# Patient Record
Sex: Female | Born: 1970 | Race: White | Hispanic: No | Marital: Married | Smoking: Former smoker
Health system: Southern US, Community
[De-identification: ages and names within clinical notes are randomized; demographics above are authoritative.]

## PROBLEM LIST (undated history)

## (undated) DIAGNOSIS — G43909 Migraine, unspecified, not intractable, without status migrainosus: Secondary | ICD-10-CM

## (undated) HISTORY — PX: GASTRIC BYPASS: SHX52

---

## 2018-04-01 ENCOUNTER — Emergency Department: Admission: EM | Admit: 2018-04-01 | Discharge: 2018-04-01 | Discharge status: Against Medical Advice | Payer: Self-pay

## 2018-06-13 ENCOUNTER — Ambulatory Visit
Admission: EM | Admit: 2018-06-13 | Discharge: 2018-06-13 | Disposition: A | Discharge status: Home / Self Care | Payer: Self-pay | Attending: Family Medicine | Admitting: Family Medicine

## 2018-06-13 ENCOUNTER — Other Ambulatory Visit: Payer: Self-pay

## 2018-06-13 ENCOUNTER — Encounter: Payer: Self-pay | Admitting: Emergency Medicine

## 2018-06-13 DIAGNOSIS — J069 Acute upper respiratory infection, unspecified: Secondary | ICD-10-CM

## 2018-06-13 DIAGNOSIS — B9789 Other viral agents as the cause of diseases classified elsewhere: Secondary | ICD-10-CM

## 2018-06-13 DIAGNOSIS — R062 Wheezing: Secondary | ICD-10-CM

## 2018-06-13 DIAGNOSIS — F17210 Nicotine dependence, cigarettes, uncomplicated: Secondary | ICD-10-CM

## 2018-06-13 MED ORDER — ALBUTEROL SULFATE HFA 108 (90 BASE) MCG/ACT IN AERS: 1.0000 | INHALATION_SPRAY | Freq: Four times a day (QID) | RESPIRATORY_TRACT | 0 refills | Status: DC | PRN

## 2018-06-13 MED ORDER — PREDNISONE 20 MG PO TABS: ORAL_TABLET | ORAL | 0 refills | Status: DC

## 2018-06-13 MED ORDER — BENZONATATE 200 MG PO CAPS: 200.0000 mg | ORAL_CAPSULE | Freq: Three times a day (TID) | ORAL | 0 refills | Status: DC | PRN

## 2018-06-13 NOTE — ED Provider Notes (Signed)
[facility]    CSN: [id number] Arrival date & time: 06/13/18  1849     History   Chief Complaint Chief Complaint  Patient presents with  . Cough  . Nasal Congestion  . Wheezing    HPI Terri Conner is a 47 y.o. female.   The history is provided by the patient.  Cough  Associated symptoms: rhinorrhea and wheezing   Wheezing  Associated symptoms: cough and rhinorrhea   URI  Presenting symptoms: congestion, cough and rhinorrhea   Severity:  Moderate Onset quality:  Sudden Duration:  4 days Timing:  Constant Progression:  Unchanged Chronicity:  New Relieved by:  None tried Ineffective treatments:  None tried Associated symptoms: wheezing   Risk factors: sick contacts   Risk factors: no recent travel     History reviewed. No pertinent past medical history.  There are no active problems to display for this patient.   Past Surgical History:  Procedure Laterality Date  . GASTRIC BYPASS      OB History   No obstetric history on file.      Home Medications    Prior to Admission medications   Medication Sig Start Date End Date Taking? Authorizing Provider  albuterol (PROVENTIL HFA;VENTOLIN HFA) 108 (90 Base) MCG/ACT inhaler Inhale 1-2 puffs into the lungs every 6 (six) hours as needed for wheezing or shortness of breath. 06/13/18   Payton Mccallum, MD  benzonatate (TESSALON) 200 MG capsule Take 1 capsule (200 mg total) by mouth 3 (three) times daily as needed. 06/13/18   Payton Mccallum, MD  predniSONE (DELTASONE) 20 MG tablet 3 tabs po once day 1, then 2 tabs po qd x 2 days, then 1 tab po qd x 2 days, then half a tab po qd x 2 days 06/13/18   Payton Mccallum, MD    Family History Family History  Problem Relation Age of Onset  . Anxiety disorder Mother   . Depression Mother   . Heart attack Father 66    Social History Social History   Tobacco Use  . Smoking status: Current Every Day Smoker    Packs/day: 1.00    Years: 18.00    Pack years:  18.00    Types: Cigarettes  . Smokeless tobacco: Never Used  Substance Use Topics  . Alcohol use: Yes    Comment: social  . Drug use: Not Currently    Comment: patient last used ~20 years ago, but wouldn't disclose     Allergies   Patient has no known allergies.   Review of Systems Review of Systems  HENT: Positive for congestion and rhinorrhea.   Respiratory: Positive for cough and wheezing.      Physical Exam Triage Vital Signs ED Triage Vitals  Enc Vitals Group     BP 06/13/18 1917 (!) 111/59     Pulse Rate 06/13/18 1917 74     Resp 06/13/18 1917 18     Temp 06/13/18 1917 98.2 F (36.8 C)     Temp Source 06/13/18 1917 Oral     SpO2 06/13/18 1917 100 %     Weight 06/13/18 1919 238 lb (108 kg)     Height 06/13/18 1919 5\' 2"  (1.575 m)     Head Circumference --      Peak Flow --      Pain Score 06/13/18 1918 0     Pain Loc --      Pain Edu? --      Excl. in GC? --  No data found.  Updated Vital Signs BP (!) 111/59 (BP Location: Left Arm)   Pulse 74   Temp 98.2 F (36.8 C) (Oral)   Resp 18   Ht 5\' 2"  (1.575 m)   Wt 108 kg   LMP 05/02/2018 (Approximate)   SpO2 100%   BMI 43.53 kg/m   Visual Acuity Right Eye Distance:   Left Eye Distance:   Bilateral Distance:    Right Eye Near:   Left Eye Near:    Bilateral Near:     Physical Exam Vitals signs and nursing note reviewed.  Constitutional:      General: She is not in acute distress.    Appearance: She is well-developed. She is not toxic-appearing or diaphoretic.  HENT:     Head: Normocephalic and atraumatic.     Nose: Rhinorrhea present.     Mouth/Throat:     Pharynx: Uvula midline. No oropharyngeal exudate.  Eyes:     General: No scleral icterus.       Right eye: No discharge.        Left eye: No discharge.  Neck:     Musculoskeletal: Normal range of motion and neck supple.     Thyroid: No thyromegaly.  Cardiovascular:     Rate and Rhythm: Normal rate and regular rhythm.     Heart  sounds: Normal heart sounds.  Pulmonary:     Effort: Pulmonary effort is normal. No respiratory distress.     Breath sounds: No stridor. Wheezing present. No rhonchi or rales.  Lymphadenopathy:     Cervical: No cervical adenopathy.  Neurological:     Mental Status: She is alert.      UC Treatments / Results  Labs (all labs ordered are listed, but only abnormal results are displayed) Labs Reviewed - No data to display  EKG None  Radiology No results found.  Procedures Procedures (including critical care time)  Medications Ordered in UC Medications - No data to display  Initial Impression / Assessment and Plan / UC Course  I have reviewed the triage vital signs and the nursing notes.  Pertinent labs & imaging results that were available during my care of the patient were reviewed by me and considered in my medical decision making (see chart for details).      Final Clinical Impressions(s) / UC Diagnoses   Final diagnoses:  Viral URI with cough  Wheezing    ED Prescriptions    Medication Sig Dispense Auth. Provider   albuterol (PROVENTIL HFA;VENTOLIN HFA) 108 (90 Base) MCG/ACT inhaler Inhale 1-2 puffs into the lungs every 6 (six) hours as needed for wheezing or shortness of breath. 1 Inhaler Nethra Mehlberg, Pamala Hurry, MD   predniSONE (DELTASONE) 20 MG tablet 3 tabs po once day 1, then 2 tabs po qd x 2 days, then 1 tab po qd x 2 days, then half a tab po qd x 2 days 10 tablet Austan Nicholl, Pamala Hurry, MD   benzonatate (TESSALON) 200 MG capsule Take 1 capsule (200 mg total) by mouth 3 (three) times daily as needed. 30 capsule Payton Mccallum, MD     1.  diagnosis reviewed with patient 2. rx as per orders above; reviewed possible side effects, interactions, risks and benefits  3. Recommend supportive treatment with rest, fluids 4. Follow-up prn if symptoms worsen or don't improve    Controlled Substance Prescriptions [place] Controlled Substance Registry consulted? Not Applicable   Payton Mccallum, MD 06/13/18 [phone]

## 2018-06-13 NOTE — ED Triage Notes (Signed)
Patient in today c/o cough, runny nose and wheezing x 4 days. Patient denies fever, but had chills x 1. Patient has tried OTC Tussin CF.

## 2018-07-05 ENCOUNTER — Other Ambulatory Visit: Payer: Self-pay

## 2018-07-05 ENCOUNTER — Encounter: Payer: Self-pay | Admitting: Emergency Medicine

## 2018-07-05 ENCOUNTER — Ambulatory Visit
Admission: EM | Admit: 2018-07-05 | Discharge: 2018-07-05 | Disposition: A | Discharge status: Home / Self Care | Payer: Self-pay | Attending: Urgent Care | Admitting: Urgent Care

## 2018-07-05 ENCOUNTER — Ambulatory Visit (INDEPENDENT_AMBULATORY_CARE_PROVIDER_SITE_OTHER): Payer: Self-pay

## 2018-07-05 DIAGNOSIS — W1842XA Slipping, tripping and stumbling without falling due to stepping into hole or opening, initial encounter: Secondary | ICD-10-CM

## 2018-07-05 DIAGNOSIS — S93402A Sprain of unspecified ligament of left ankle, initial encounter: Secondary | ICD-10-CM

## 2018-07-05 DIAGNOSIS — M25572 Pain in left ankle and joints of left foot: Secondary | ICD-10-CM

## 2018-07-05 DIAGNOSIS — S86002A Unspecified injury of left Achilles tendon, initial encounter: Secondary | ICD-10-CM

## 2018-07-05 MED ORDER — IBUPROFEN 800 MG PO TABS: 800.0000 mg | ORAL_TABLET | Freq: Three times a day (TID) | ORAL | 0 refills | Status: DC

## 2018-07-05 MED ORDER — PREDNISONE 20 MG PO TABS: 40.0000 mg | ORAL_TABLET | Freq: Every day | ORAL | 0 refills | Status: DC

## 2018-07-05 NOTE — ED Triage Notes (Signed)
Patient c/o left ankle injury today when she missed a step and rolled her ankle.

## 2018-07-05 NOTE — Discharge Instructions (Signed)
Nice to see you tonight. Sorry that it was under these circumstances.   Your x-ray was negative for fracture. Pain due to a sprained ankle. You have some strain in your Achilles area as well. We have wrapped your ankle. Keep your ankle wrapped for comfort. Also, use ice and elevation to help with the pain. I will send you in a prescription of ibuprofen 800 mg that you can take 3 times a day for pain and inflammation. I am also going to give you some steroids for the inflammation.   If you are still having pain in 1 week, please follow up with orthopedics. I have included the name and number of an excellent provider.   Quentin Mulling, MSN, APRN, FNP-C, CEN Advanced Practice Provider [facility] 07/05/2018 7:25 PM  .

## 2018-07-05 NOTE — ED Provider Notes (Signed)
[address], [address] [place], [place] [address] [phone]   Time seen: Approximately 6:56 PM  Chief Complaint Ankle Pain   Prior to seeing the patient today, I have reviewed the triage nursing documentation and vital signs.   HPI Terri Conner is a 48 y.o. female who presents with c/o LEFT ankle pain s/p inversion injury just prior to arrival. Patient notes that she "missed" the last stepping stone and rolled her ankle. Patient with pain to both the inner and outer aspect of the LEFT ankle. (+) swelling noted. (+) painful AROM/PROM. Foot with (+) PMS; warm and dry; cap refill WNL. She denies previous injuries to this ankle. Patient in unable to bear weight.   History reviewed. No pertinent past medical history.  There are no active problems to display for this patient.   Past Surgical History:  Procedure Laterality Date  . GASTRIC BYPASS      No current facility-administered medications for this encounter.  No current outpatient medications on file.  Allergies Patient has no known allergies.  Family history Family History  Problem Relation Age of Onset  . Anxiety disorder Mother   . Depression Mother   . Heart attack Father 12    Social history Social History   Tobacco Use  . Smoking status: Current Every Day Smoker    Packs/day: 1.00    Years: 18.00    Pack years: 18.00    Types: Cigarettes  . Smokeless tobacco: Never Used  Substance Use Topics  . Alcohol use: Yes    Comment: social  . Drug use: Not Currently    Comment: patient last used ~20 years ago, but wouldn't disclose    Review of Systems Review of Systems  Constitutional: Negative for chills and fever.  HENT: Negative.   Eyes: Negative.   Respiratory: Positive for cough (chronic related to smoking ). Negative for shortness of breath.   Cardiovascular: Negative for chest pain and palpitations.  Genitourinary: Negative.   Musculoskeletal: Positive for joint swelling (LEFT ankle s/p  inversion injury).  Skin: Negative for color change and rash.  Neurological: Negative for dizziness.  Psychiatric/Behavioral: Negative.   All other systems reviewed and are negative.   ____________________________________________  PHYSICAL EXAM:  VITAL SIGNS: Today's Vitals   07/05/18 1855 07/05/18 1857  BP:  (!) 117/47  Pulse:  61  Resp:  18  Temp:  98.1 F (36.7 C)  TempSrc:  Oral  SpO2:  100%  Weight: 238 lb (108 kg)   Height: 5\' 2"  (1.575 m)   PainSc: 2      Physical Exam  Constitutional: She is oriented to person, place, and time and well-developed, well-nourished, and in no distress.  HENT:  Head: Normocephalic and atraumatic.  Mouth/Throat: Oropharynx is clear and moist and mucous membranes are normal.  Eyes: Pupils are equal, round, and reactive to light. EOM are normal. No scleral icterus.  Neck: Normal range of motion.  Cardiovascular: Normal rate, regular rhythm, normal heart sounds and intact distal pulses. Exam reveals no gallop and no friction rub.  No murmur heard. Pulmonary/Chest: Effort normal. No respiratory distress. She has wheezes (scattered). She has no rales.  Musculoskeletal:        General: Tenderness and edema present.     Left ankle: She exhibits decreased range of motion and swelling. Tenderness. Lateral malleolus and medial malleolus tenderness found.  Neurological: She is alert and oriented to person, place, and time.  Skin: Skin is warm and dry. No rash noted. No erythema.  Psychiatric: Mood, affect and judgment normal.  Nursing note and vitals reviewed.  ____________________________________________  RADIOLOGY  Dg Ankle Complete Left  Result Date: 07/05/2018 CLINICAL DATA:  LEFT ankle pain after fall. EXAM: LEFT ANKLE COMPLETE - 3+ VIEW COMPARISON:  None. FINDINGS: No fracture deformity nor dislocation. The ankle mortise appears congruent and the tibiofibular syndesmosis intact. Large plantar calcaneal spur. Achilles insertional  enthesopathy. No destructive bony lesions. Soft tissue swelling without subcutaneous gas or radiopaque foreign bodies. IMPRESSION: 1. Soft tissue swelling.  No acute osseous process. Electronically Signed   By: Awilda Metro M.D.   On: 07/05/2018 19:08   ____________________________________________   INITIAL IMPRESSION / ASSESSMENT AND PLAN / URGENT CARE COURSE  Pertinent labs & imaging results that were available during my care of the patient were personally reviewed by me and considered in my medical decision making (see chart for details).   Terri Conner is a 48 y.o. female who presents with complaints of LEFT ankle pain s/p an acute inversion injury prior to arrival. Ankle is tender and swollen. The majority of her pain is over the medial malleolus. No ecchymosis noted. (+) PMS; foot warm and dry; cap refill WNL.   Films reviewed. No acute osseous process noted. Film suggests soft tissue swelling and insertional enthesopathy in the Achilles area. No concern for tendon rupture as patient is able to dorsiflex and plantarflex her foot. Exam reveals no joint laxity. Discussed conservative treatment with NSAIDs and steroids to decrease her pain and inflammation. Patient placed in an ACE wrap and encouraged to utilize ice and elevation for added palliative effect. Discussed stretching and AROM exercises as tolerated.   If patient is not improving within 1 week, she will require further evaluation by orthopedics. I provided her with office contact information for Dr. Rosita Kea.  I have reviewed the follow up and strict return precautions for any new or worsening symptoms. Patient is aware of symptoms that would be deemed urgent/emergent, and would thus require further evaluation either here or in the emergency department. At the time of discharge, she verbalized understanding and consent with the discharge plan as it was reviewed with her. All questions were fielded by provider and/or clinic staff prior to  patient discharge.   ____________________________________________  FINAL CLINICAL IMPRESSION(S) / URGENT CARE DIAGNOSES  Final diagnoses:  Sprain of left ankle, unspecified ligament, initial encounter  Achilles tendon injury, left, initial encounter    ED Discharge Orders         Ordered    Apply wrap     07/05/18 1923    ibuprofen (ADVIL,MOTRIN) 800 MG tablet  3 times daily     07/05/18 1923    predniSONE (DELTASONE) 20 MG tablet  Daily     07/05/18 1923         Note: This note was prepared using Dragon dictation along with smaller phrase technology. Despite my best ability to proofread, transcriptional errors may occur from this process, and are completely unintentional.     Verlee Monte, NP 07/05/18 [id number]

## 2018-07-21 ENCOUNTER — Ambulatory Visit
Admission: EM | Admit: 2018-07-21 | Discharge: 2018-07-21 | Disposition: A | Discharge status: Home / Self Care | Payer: Self-pay | Attending: Family Medicine | Admitting: Family Medicine

## 2018-07-21 ENCOUNTER — Other Ambulatory Visit: Payer: Self-pay

## 2018-07-21 DIAGNOSIS — N6452 Nipple discharge: Secondary | ICD-10-CM

## 2018-07-21 DIAGNOSIS — N644 Mastodynia: Secondary | ICD-10-CM

## 2018-07-21 MED ORDER — DOXYCYCLINE HYCLATE 100 MG PO CAPS: 100.0000 mg | ORAL_CAPSULE | Freq: Two times a day (BID) | ORAL | 0 refills | Status: DC

## 2018-07-21 NOTE — ED Provider Notes (Signed)
[facility]    CSN: [id number] Arrival date & time: 07/21/18  1144  History   Chief Complaint Chief Complaint  Patient presents with  . Breast Problem   HPI  48 year old female presents with the above complaint.  Patient reports that she pulled a [employer] shopping cart back towards her approximately 1 month ago and injured her left breast.  Patient states that her breast has been bothering her since then.  She is having mild to moderate pain.  Over the past week she has developed a red, swollen area located at the nipple.  She states that she has had nipple discharge.  She has had some recent nausea.  No documented fever.  She reports the discharge from her nipple is yellow in coloration.  Her pain is moderate currently, 6/10.  Exacerbated by touch.  No relieving factors.  No other associated symptoms.  Denies breast mass. Has never had a mammogram.  PMH, Surgical Hx, Family Hx, Social History reviewed and updated as below.  PMH: morbid obesity, tobacco abuse  Past Surgical History:  Procedure Laterality Date  . GASTRIC BYPASS      OB History   No obstetric history on file.      Home Medications    Prior to Admission medications   Medication Sig Start Date End Date Taking? Authorizing Provider  doxycycline (VIBRAMYCIN) 100 MG capsule Take 1 capsule (100 mg total) by mouth 2 (two) times daily. 07/21/18   Tommie Sams, DO    Family History Family History  Problem Relation Age of Onset  . Anxiety disorder Mother   . Depression Mother   . Heart attack Father 25    Social History Social History   Tobacco Use  . Smoking status: Current Every Day Smoker    Packs/day: 1.00    Years: 18.00    Pack years: 18.00    Types: Cigarettes  . Smokeless tobacco: Never Used  Substance Use Topics  . Alcohol use: Yes    Comment: social  . Drug use: Not Currently    Comment: patient last used ~20 years ago, but wouldn't disclose     Allergies   Patient has no  known allergies.   Review of Systems Review of Systems  Gastrointestinal: Positive for nausea.  Breast - Nipple pain, swelling, discharge.  Physical Exam Triage Vital Signs ED Triage Vitals  Enc Vitals Group     BP 07/21/18 1153 133/63     Pulse Rate 07/21/18 1153 60     Resp 07/21/18 1153 17     Temp 07/21/18 1153 98.3 F (36.8 C)     Temp Source 07/21/18 1153 Oral     SpO2 07/21/18 1153 100 %     Weight 07/21/18 1154 238 lb (108 kg)     Height 07/21/18 1154 5\' 2"  (1.575 m)     Head Circumference --      Peak Flow --      Pain Score 07/21/18 1154 6     Pain Loc --      Pain Edu? --      Excl. in GC? --    Updated Vital Signs BP 133/63 (BP Location: Right Arm)   Pulse 60   Temp 98.3 F (36.8 C) (Oral)   Resp 17   Ht 5\' 2"  (1.575 m)   Wt 108 kg   LMP 07/15/2018   SpO2 100%   BMI 43.53 kg/m   Visual Acuity Right Eye Distance:   Left Eye  Distance:   Bilateral Distance:    Right Eye Near:   Left Eye Near:    Bilateral Near:     Physical Exam Vitals signs and nursing note reviewed. Exam conducted with a chaperone present.  Constitutional:      General: She is not in acute distress.    Appearance: Normal appearance. She is obese.  HENT:     Head: Normocephalic and atraumatic.  Eyes:     General:        Right eye: No discharge.        Left eye: No discharge.     Conjunctiva/sclera: Conjunctivae normal.  Pulmonary:     Effort: Pulmonary effort is normal. No respiratory distress.  Chest:     Comments: Left breast -small, raised erythematous area at the inferior aspect of the nipple.  There is no current discharge.  Tender to palpation.  No palpable discrete breast mass. Neurological:     Mental Status: She is alert.  Psychiatric:        Mood and Affect: Mood normal.        Behavior: Behavior normal.    UC Treatments / Results  Labs (all labs ordered are listed, but only abnormal results are displayed) Labs Reviewed - No data to display  EKG None   Radiology No results found.  Procedures Procedures (including critical care time)  Medications Ordered in UC Medications - No data to display  Initial Impression / Assessment and Plan / UC Course  I have reviewed the triage vital signs and the nursing notes.  Pertinent labs & imaging results that were available during my care of the patient were reviewed by me and considered in my medical decision making (see chart for details).    48 year old female presents with nipple discharge, no discrete areas of pain, swelling.  Concern for underlying infection, placing on doxycycline.  Patient will need mammogram. Will arrange early next week.  Final Clinical Impressions(s) / UC Diagnoses   Final diagnoses:  Nipple discharge  Nipple pain     Discharge Instructions     Antibiotic twice daily (take with food).  We will call regarding mammogram.  Take care  Dr. Adriana Simasook    ED Prescriptions    Medication Sig Dispense Auth. Provider   doxycycline (VIBRAMYCIN) 100 MG capsule Take 1 capsule (100 mg total) by mouth 2 (two) times daily. 14 capsule Tommie Samsook, Marilyn Nihiser G, DO     Controlled Substance Prescriptions [place] Controlled Substance Registry consulted? Not Applicable   Tommie SamsCook, Vernesha Talbot G, DO 07/21/18 1254

## 2018-07-21 NOTE — ED Triage Notes (Signed)
Pt with yellow discharge from left nipple x past month. Pain 6/10

## 2018-07-21 NOTE — Discharge Instructions (Signed)
Antibiotic twice daily (take with food).  We will call regarding mammogram.  Take care  Dr. Adriana Simas

## 2018-07-23 ENCOUNTER — Telehealth: Payer: Self-pay

## 2018-07-30 ENCOUNTER — Other Ambulatory Visit: Payer: Self-pay

## 2018-07-30 ENCOUNTER — Ambulatory Visit
Admission: RE | Admit: 2018-07-30 | Discharge: 2018-07-30 | Disposition: A | Discharge status: Home / Self Care | Payer: Self-pay | Source: Ambulatory Visit | Attending: Family Medicine | Admitting: Family Medicine

## 2018-10-26 ENCOUNTER — Other Ambulatory Visit: Payer: Self-pay

## 2018-10-26 DIAGNOSIS — Z20822 Contact with and (suspected) exposure to covid-19: Secondary | ICD-10-CM

## 2018-10-29 LAB — NOVEL CORONAVIRUS, NAA: SARS-CoV-2, NAA: NOT DETECTED

## 2019-01-14 ENCOUNTER — Emergency Department
Admission: EM | Admit: 2019-01-14 | Discharge: 2019-01-14 | Disposition: A | Discharge status: Home / Self Care | Payer: Self-pay | Attending: Student in an Organized Health Care Education/Training Program | Admitting: Student in an Organized Health Care Education/Training Program

## 2019-01-14 ENCOUNTER — Emergency Department: Payer: Self-pay

## 2019-01-14 ENCOUNTER — Other Ambulatory Visit: Payer: Self-pay

## 2019-01-14 ENCOUNTER — Encounter: Payer: Self-pay | Admitting: Emergency Medicine

## 2019-01-14 DIAGNOSIS — Y92410 Unspecified street and highway as the place of occurrence of the external cause: Secondary | ICD-10-CM | POA: Insufficient documentation

## 2019-01-14 DIAGNOSIS — S161XXA Strain of muscle, fascia and tendon at neck level, initial encounter: Secondary | ICD-10-CM | POA: Insufficient documentation

## 2019-01-14 DIAGNOSIS — Y999 Unspecified external cause status: Secondary | ICD-10-CM | POA: Insufficient documentation

## 2019-01-14 DIAGNOSIS — J40 Bronchitis, not specified as acute or chronic: Secondary | ICD-10-CM | POA: Insufficient documentation

## 2019-01-14 DIAGNOSIS — F17210 Nicotine dependence, cigarettes, uncomplicated: Secondary | ICD-10-CM | POA: Insufficient documentation

## 2019-01-14 DIAGNOSIS — Y93I9 Activity, other involving external motion: Secondary | ICD-10-CM | POA: Insufficient documentation

## 2019-01-14 MED ORDER — CYCLOBENZAPRINE HCL 10 MG PO TABS: 10.0000 mg | ORAL_TABLET | Freq: Three times a day (TID) | ORAL | 0 refills | Status: DC | PRN

## 2019-01-14 MED ORDER — ALBUTEROL SULFATE HFA 108 (90 BASE) MCG/ACT IN AERS: 2.0000 | INHALATION_SPRAY | RESPIRATORY_TRACT | 1 refills | Status: DC | PRN

## 2019-01-14 MED ORDER — PREDNISONE 10 MG (21) PO TBPK: ORAL_TABLET | ORAL | 0 refills | Status: DC
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
## 2019-01-14 NOTE — Discharge Instructions (Signed)
Follow-up with primary care for symptoms that are not improving over the week.  Return to the emergency department for symptoms that change or worsen if unable to schedule appointment.
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
## 2019-09-20 ENCOUNTER — Emergency Department: Payer: Self-pay

## 2019-09-20 ENCOUNTER — Other Ambulatory Visit: Payer: Self-pay

## 2019-09-20 ENCOUNTER — Encounter: Payer: Self-pay | Admitting: Emergency Medicine

## 2019-09-20 DIAGNOSIS — R0602 Shortness of breath: Secondary | ICD-10-CM | POA: Insufficient documentation

## 2019-09-20 DIAGNOSIS — Z5321 Procedure and treatment not carried out due to patient leaving prior to being seen by health care provider: Secondary | ICD-10-CM | POA: Insufficient documentation

## 2019-09-20 LAB — CBC
HCT: 30.6 % — ABNORMAL LOW (ref 36.0–46.0)
Hemoglobin: 7.9 g/dL — ABNORMAL LOW (ref 12.0–15.0)
MCH: 16.2 pg — ABNORMAL LOW (ref 26.0–34.0)
MCHC: 25.8 g/dL — ABNORMAL LOW (ref 30.0–36.0)
MCV: 62.7 fL — ABNORMAL LOW (ref 80.0–100.0)
Platelets: 212 10*3/uL (ref 150–400)
RBC: 4.88 MIL/uL (ref 3.87–5.11)
RDW: 20.2 % — ABNORMAL HIGH (ref 11.5–15.5)
WBC: 9 10*3/uL (ref 4.0–10.5)
nRBC: 0 % (ref 0.0–0.2)

## 2019-09-20 LAB — BASIC METABOLIC PANEL
Anion gap: 5 (ref 5–15)
BUN: 17 mg/dL (ref 6–20)
CO2: 26 mmol/L (ref 22–32)
Calcium: 9 mg/dL (ref 8.9–10.3)
Chloride: 106 mmol/L (ref 98–111)
Creatinine, Ser: 0.74 mg/dL (ref 0.44–1.00)
GFR calc Af Amer: >60 mL/min (ref >60)
GFR calc non Af Amer: >60 mL/min (ref >60)
Glucose, Bld: 117 mg/dL — ABNORMAL HIGH (ref 70–99)
Potassium: 4.1 mmol/L (ref 3.5–5.1)
Sodium: 137 mmol/L (ref 135–145)

## 2019-09-20 LAB — TROPONIN I (HIGH SENSITIVITY): Troponin I (High Sensitivity): 2 ng/L (ref <18)

## 2019-09-20 NOTE — ED Triage Notes (Signed)
Pt reports she received 2nd dose of Covid vaccine Moderna yesterday and to day developed itching sensation all over her body, pt reports she sees a rash, RN unable to visible see a rash on pt's  chest or neck. Pt talks in complete sentences no distress noted

## 2019-09-21 ENCOUNTER — Emergency Department
Admission: EM | Admit: 2019-09-21 | Discharge: 2019-09-21 | Disposition: A | Discharge status: Home / Self Care | Payer: Self-pay | Attending: Emergency Medicine | Admitting: Emergency Medicine

## 2019-09-21 ENCOUNTER — Encounter: Payer: Self-pay | Admitting: Family Medicine

## 2019-10-14 ENCOUNTER — Emergency Department
Admission: EM | Admit: 2019-10-14 | Discharge: 2019-10-14 | Disposition: A | Discharge status: Home / Self Care | Payer: Self-pay | Attending: Emergency Medicine | Admitting: Emergency Medicine

## 2019-10-14 ENCOUNTER — Emergency Department: Payer: Self-pay

## 2019-10-14 ENCOUNTER — Encounter: Payer: Self-pay | Admitting: Emergency Medicine

## 2019-10-14 DIAGNOSIS — F17210 Nicotine dependence, cigarettes, uncomplicated: Secondary | ICD-10-CM | POA: Insufficient documentation

## 2019-10-14 DIAGNOSIS — Z79899 Other long term (current) drug therapy: Secondary | ICD-10-CM | POA: Insufficient documentation

## 2019-10-14 DIAGNOSIS — G43819 Other migraine, intractable, without status migrainosus: Secondary | ICD-10-CM

## 2019-10-14 DIAGNOSIS — G43809 Other migraine, not intractable, without status migrainosus: Secondary | ICD-10-CM | POA: Insufficient documentation

## 2019-10-14 HISTORY — DX: Migraine, unspecified, not intractable, without status migrainosus: G43.909

## 2019-10-14 LAB — CBC
HCT: 33.3 % — ABNORMAL LOW (ref 36.0–46.0)
Hemoglobin: 8.6 g/dL — ABNORMAL LOW (ref 12.0–15.0)
MCH: 16.1 pg — ABNORMAL LOW (ref 26.0–34.0)
MCHC: 25.8 g/dL — ABNORMAL LOW (ref 30.0–36.0)
MCV: 62.5 fL — ABNORMAL LOW (ref 80.0–100.0)
Platelets: 197 10*3/uL (ref 150–400)
RBC: 5.33 MIL/uL — ABNORMAL HIGH (ref 3.87–5.11)
RDW: 20.6 % — ABNORMAL HIGH (ref 11.5–15.5)
WBC: 5.7 10*3/uL (ref 4.0–10.5)
nRBC: 0 % (ref 0.0–0.2)

## 2019-10-14 LAB — DIFFERENTIAL
Abs Immature Granulocytes: 0.02 10*3/uL (ref 0.00–0.07)
Basophils Absolute: 0.1 10*3/uL (ref 0.0–0.1)
Basophils Relative: 2 %
Eosinophils Absolute: 0.1 10*3/uL (ref 0.0–0.5)
Eosinophils Relative: 1 %
Immature Granulocytes: 0 %
Lymphocytes Relative: 25 %
Lymphs Abs: 1.4 10*3/uL (ref 0.7–4.0)
Monocytes Absolute: 0.4 10*3/uL (ref 0.1–1.0)
Monocytes Relative: 7 %
Neutro Abs: 3.7 10*3/uL (ref 1.7–7.7)
Neutrophils Relative %: 65 %
Smear Review: NORMAL

## 2019-10-14 LAB — COMPREHENSIVE METABOLIC PANEL
ALT: 15 U/L (ref 0–44)
AST: 18 U/L (ref 15–41)
Albumin: 3.9 g/dL (ref 3.5–5.0)
Alkaline Phosphatase: 63 U/L (ref 38–126)
Anion gap: 5 (ref 5–15)
BUN: 14 mg/dL (ref 6–20)
CO2: 25 mmol/L (ref 22–32)
Calcium: 8.7 mg/dL — ABNORMAL LOW (ref 8.9–10.3)
Chloride: 106 mmol/L (ref 98–111)
Creatinine, Ser: 0.65 mg/dL (ref 0.44–1.00)
GFR calc Af Amer: >60 mL/min (ref >60)
GFR calc non Af Amer: >60 mL/min (ref >60)
Glucose, Bld: 91 mg/dL (ref 70–99)
Potassium: 4.3 mmol/L (ref 3.5–5.1)
Sodium: 136 mmol/L (ref 135–145)
Total Bilirubin: 0.6 mg/dL (ref 0.3–1.2)
Total Protein: 7.2 g/dL (ref 6.5–8.1)

## 2019-10-14 LAB — POCT PREGNANCY, URINE: Preg Test, Ur: NEGATIVE

## 2019-10-14 LAB — GLUCOSE, CAPILLARY: Glucose-Capillary: 87 mg/dL (ref 70–99)

## 2019-10-14 LAB — APTT: aPTT: 27 seconds (ref 24–36)

## 2019-10-14 LAB — PROTIME-INR
INR: 1 (ref 0.8–1.2)
Prothrombin Time: 12.4 seconds (ref 11.4–15.2)

## 2019-10-14 MED ORDER — SODIUM CHLORIDE 0.9 % IV BOLUS
1000.0000 mL | Freq: Once | INTRAVENOUS | Status: AC
  Administered 2019-10-14: 1000 mL via INTRAVENOUS

## 2019-10-14 MED ORDER — PROCHLORPERAZINE EDISYLATE 10 MG/2ML IJ SOLN
10.0000 mg | Freq: Once | INTRAMUSCULAR | Status: AC
  Administered 2019-10-14: 10 mg via INTRAVENOUS
  Filled 2019-10-14: qty 2

## 2019-10-14 MED ORDER — ACETAMINOPHEN 500 MG PO TABS
1000.0000 mg | ORAL_TABLET | Freq: Once | ORAL | Status: AC
  Administered 2019-10-14: 1000 mg via ORAL
  Filled 2019-10-14: qty 2

## 2019-10-14 MED ORDER — DIPHENHYDRAMINE HCL 50 MG/ML IJ SOLN
25.0000 mg | Freq: Once | INTRAMUSCULAR | Status: AC
  Administered 2019-10-14: 25 mg via INTRAVENOUS
  Filled 2019-10-14: qty 1

## 2019-10-14 NOTE — ED Triage Notes (Addendum)
Pt in via [facility] from home with complaints of sudden onset shooting pain from posterior neck up into head, associate with dizziness, nausea, photophobia.  Reports hx of migraines.  A/Ox4, neurologically intact, NAD noted at this time.

## 2019-10-14 NOTE — ED Triage Notes (Signed)
Pt comes into the ED via EMS from home with c/o HA with photophobia, with a hx of migraines

## 2019-10-14 NOTE — ED Provider Notes (Signed)
[facility] Emergency Department Provider Note  ____________________________________________   First MD Initiated Contact with Patient 10/14/19 1232     (approximate)  I have reviewed the triage vital signs and the nursing notes.   HISTORY  Chief Complaint Dizziness and Headache    HPI Terri Conner is a 49 y.o. female with migraines who comes in from home with headache.  Patient reports having headaches daily for the majority of her life.  She states that she had  severe migraines.  She said that today this migraine was severe, started at 1030, radiating from the back of her neck up into her head, nothing made it better, nothing made it worse.  Patient states that she was not exerting herself prior to the headache starting.  She did have a little bit of sensitivity to light and nausea and dizziness.  Patient now states that her main system is more of just the headache and the pain rather than the dizziness.  She reports a little bit of nausea but she states that that she is because she has not eaten yet.  Denies any shortness of breath, chest pain.  She denies ever seeing a neurologist for her headaches in the past.  She only takes ibuprofen at home for her headaches.  She denies any recent trauma or neck manipulation or fevers.  Headache now is over her entire head.  She reports that this feels similar to her prior migraines but worse than her normal headaches          Past Medical History:  Diagnosis Date  . Migraines     There are no problems to display for this patient.   Past Surgical History:  Procedure Laterality Date  . GASTRIC BYPASS    . GASTRIC BYPASS      Prior to Admission medications   Medication Sig Start Date End Date Taking? Authorizing Provider  albuterol (VENTOLIN HFA) 108 (90 Base) MCG/ACT inhaler Inhale 2 puffs into the lungs every 4 (four) hours as needed for wheezing or shortness of breath. 01/14/19   Triplett, Rulon Eisenmenger B, FNP    cyclobenzaprine (FLEXERIL) 10 MG tablet Take 1 tablet (10 mg total) by mouth 3 (three) times daily as needed. 01/14/19   Triplett, Rulon Eisenmenger B, FNP  doxycycline (VIBRAMYCIN) 100 MG capsule Take 1 capsule (100 mg total) by mouth 2 (two) times daily. 07/21/18   Tommie Sams, DO  predniSONE (STERAPRED UNI-PAK 21 TAB) 10 MG (21) TBPK tablet Take 6 tablets on the first day and decrease by 1 tablet each day until finished. 01/14/19   Chinita Pester, FNP    Allergies Patient has no known allergies.  Family History  Problem Relation Age of Onset  . Anxiety disorder Mother   . Depression Mother   . Heart attack Father 59    Social History Social History   Tobacco Use  . Smoking status: Current Every Day Smoker    Packs/day: 1.00    Years: 18.00    Pack years: 18.00    Types: Cigarettes  . Smokeless tobacco: Never Used  Vaping Use  . Vaping Use: Never used  Substance Use Topics  . Alcohol use: Yes    Comment: social  . Drug use: Not Currently      Review of Systems Constitutional: No fever/chills.  Dizziness Eyes: No visual changes.  Sensitivity to light ENT: No sore throat. Cardiovascular: Denies chest pain. Respiratory: Denies shortness of breath. Gastrointestinal: No abdominal pain.  No nausea,  no vomiting.  No diarrhea.  No constipation. Genitourinary: Negative for dysuria. Musculoskeletal: Negative for back pain. Skin: Negative for rash. Neurological: Positive headache, no focal weakness or numbness. All other ROS negative ____________________________________________   PHYSICAL EXAM:  VITAL SIGNS: ED Triage Vitals  Enc Vitals Group     BP 10/14/19 1208 117/62     Pulse Rate 10/14/19 1208 (!) 59     Resp 10/14/19 1208 18     Temp 10/14/19 1208 (!) 97.5 F (36.4 C)     Temp Source 10/14/19 1208 Oral     SpO2 10/14/19 1208 99 %     Weight 10/14/19 1208 240 lb (108.9 kg)     Height 10/14/19 1208 5\' 3"  (1.6 m)     Head Circumference --      Peak Flow --       Pain Score 10/14/19 1219 9     Pain Loc --      Pain Edu? --      Excl. in GC? --     Constitutional: Alert and oriented.  Does appear sensitive to light.  Eyes were closed with a blanket overhead but she is able to the blanket off and open her eyes for my discussion Eyes: Conjunctivae are normal. EOMI. Head: Atraumatic. Nose: No congestion/rhinnorhea. Mouth/Throat: Mucous membranes are moist.   Neck: No stridor. Trachea Midline. FROM Cardiovascular: Normal rate, regular rhythm. Grossly normal heart sounds.  Good peripheral circulation. Respiratory: Normal respiratory effort.  No retractions. Lungs CTAB. Gastrointestinal: Soft and nontender. No distention. No abdominal bruits.  Musculoskeletal: No lower extremity tenderness nor edema.  No joint effusions. Neurologic: Cranial nerves II through XII are intact, equal strength in arms and legs, finger-nose intact Skin:  Skin is warm, dry and intact. No rash noted. Psychiatric: Mood and affect are normal. Speech and behavior are normal. GU: Deferred   ____________________________________________   LABS (all labs ordered are listed, but only abnormal results are displayed)  Labs Reviewed  CBC - Abnormal; Notable for the following components:      Result Value   RBC 5.33 (*)    Hemoglobin 8.6 (*)    HCT 33.3 (*)    MCV 62.5 (*)    MCH 16.1 (*)    MCHC 25.8 (*)    RDW 20.6 (*)    All other components within normal limits  COMPREHENSIVE METABOLIC PANEL - Abnormal; Notable for the following components:   Calcium 8.7 (*)    All other components within normal limits  GLUCOSE, CAPILLARY  PROTIME-INR  APTT  DIFFERENTIAL  POC URINE PREG, ED  POCT PREGNANCY, URINE   ____________________________________________   ED ECG REPORT I, 12/15/19, the attending physician, personally viewed and interpreted this ECG.  Normal sinus rate of 63, no ST elevation, no T wave inversions, normal  intervals ____________________________________________  RADIOLOGY Concha Se, personally viewed and evaluated these images (plain radiographs) as part of my medical decision making, as well as reviewing the written report by the radiologist.  ED MD interpretation: No evidence of intracranial hemorrhage  Official radiology report(s): CT HEAD WO CONTRAST  Result Date: 10/14/2019 CLINICAL DATA:  Acute headache.  Normal neuro exam. EXAM: CT HEAD WITHOUT CONTRAST TECHNIQUE: Contiguous axial images were obtained from the base of the skull through the vertex without intravenous contrast. COMPARISON:  None. FINDINGS: Brain: No evidence of acute infarction, hemorrhage, hydrocephalus, extra-axial collection or mass lesion/mass effect. Vascular: No hyperdense vessel or unexpected calcification. Skull: Negative Sinuses/Orbits: Negative Other:  None IMPRESSION: Negative CT head Electronically Signed   By: Marlan Palau M.D.   On: 10/14/2019 12:57    ____________________________________________   PROCEDURES  Procedure(s) performed (including Critical Care):  Procedures   ____________________________________________   INITIAL IMPRESSION / ASSESSMENT AND PLAN / ED COURSE  Terri Conner was evaluated in Emergency Department on 10/14/2019 for the symptoms described in the history of present illness. She was evaluated in the context of the global COVID-19 pandemic, which necessitated consideration that the patient might be at risk for infection with the SARS-CoV-2 virus that causes COVID-19. Institutional protocols and algorithms that pertain to the evaluation of patients at risk for COVID-19 are in a state of rapid change based on information released by regulatory bodies including the CDC and federal and state organizations. These policies and algorithms were followed during the patient's care in the ED.     Patient is a 49 year old who comes in with headache, dizziness, nausea.  Patient symptoms  started 2-1/2 hours ago.  Suspect is most likely related to migraine given her prior history of migraines but given she is within such a close window of her symptoms started will get a CT head to make sure evidence of intracranial hemorrhage given the high sensitivity.  This will also rule out a mass given she does report daily headaches.  At this time her finger-to-nose is intact and she does not feel significantly dizzy I have lower suspicion for stroke or posterior stroke.  Upon my evaluation patient was already being called for CT scan.  She denies any neck manipulation or trauma to suggest dissection.  Discussed with patient the high sensitivity of CT scan within 6 hours of symptom onset.  We discussed gold standard to rule out intracranial hemorrhage in his lumbar puncture but at this time given low suspicion given patient's history of migraines in the CT head was negative were going to hold off on lumbar puncture.  We will give patient a migraine cocktail and reevaluate patient  Patient feeling much better after the migraine cocktail.  Patient is requesting discharge home at this time.  Offered to give her any other medication such as Toradol to help with the rest of her headache and she states that she is feeling much better and she would like to go home at this time  I discussed the provisional nature of ED diagnosis, the treatment so far, the ongoing plan of care, follow up appointments and return precautions with the patient and any family or support people present. They expressed understanding and agreed with the plan, discharged home.     ____________________________________________   FINAL CLINICAL IMPRESSION(S) / ED DIAGNOSES   Final diagnoses:  Other migraine without status migrainosus, intractable      MEDICATIONS GIVEN DURING THIS VISIT:  Medications  sodium chloride 0.9 % bolus 1,000 mL (1,000 mLs Intravenous New Bag/Given 10/14/19 1331)  prochlorperazine (COMPAZINE)  injection 10 mg (10 mg Intravenous Given 10/14/19 1333)  diphenhydrAMINE (BENADRYL) injection 25 mg (25 mg Intravenous Given 10/14/19 1335)  acetaminophen (TYLENOL) tablet 1,000 mg (1,000 mg Oral Given 10/14/19 1340)     ED Discharge Orders    None       Note:  This document was prepared using Dragon voice recognition software and may include unintentional dictation errors.   Concha Se, MD 10/14/19 [phone]

## 2019-10-14 NOTE — Discharge Instructions (Addendum)
This is most likely secondary to her migraine.  Your CT scan was negative and her hemoglobin was trending up from her prior.  Return to ER if develop worsening pain or any other concerns.  Given you have chronic migraines going to follow-up with neurology to be started on a preventative medication.

## 2019-10-14 NOTE — ED Notes (Signed)
Pt presentation discussed with EDP, Kinner; pt roomed at this time.
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
## 2020-10-20 IMAGING — CR DG CHEST 2V
1 series · 2 of 2 positions shown · non-contrast
Comparison: 01/14/2019

CLINICAL DATA: Chest pain.

EXAM:
CHEST - 2 VIEW

[Series 1: dg chest 2 view · 0.14mm/px · 2 of 2 slices shown]
[im 1/2]
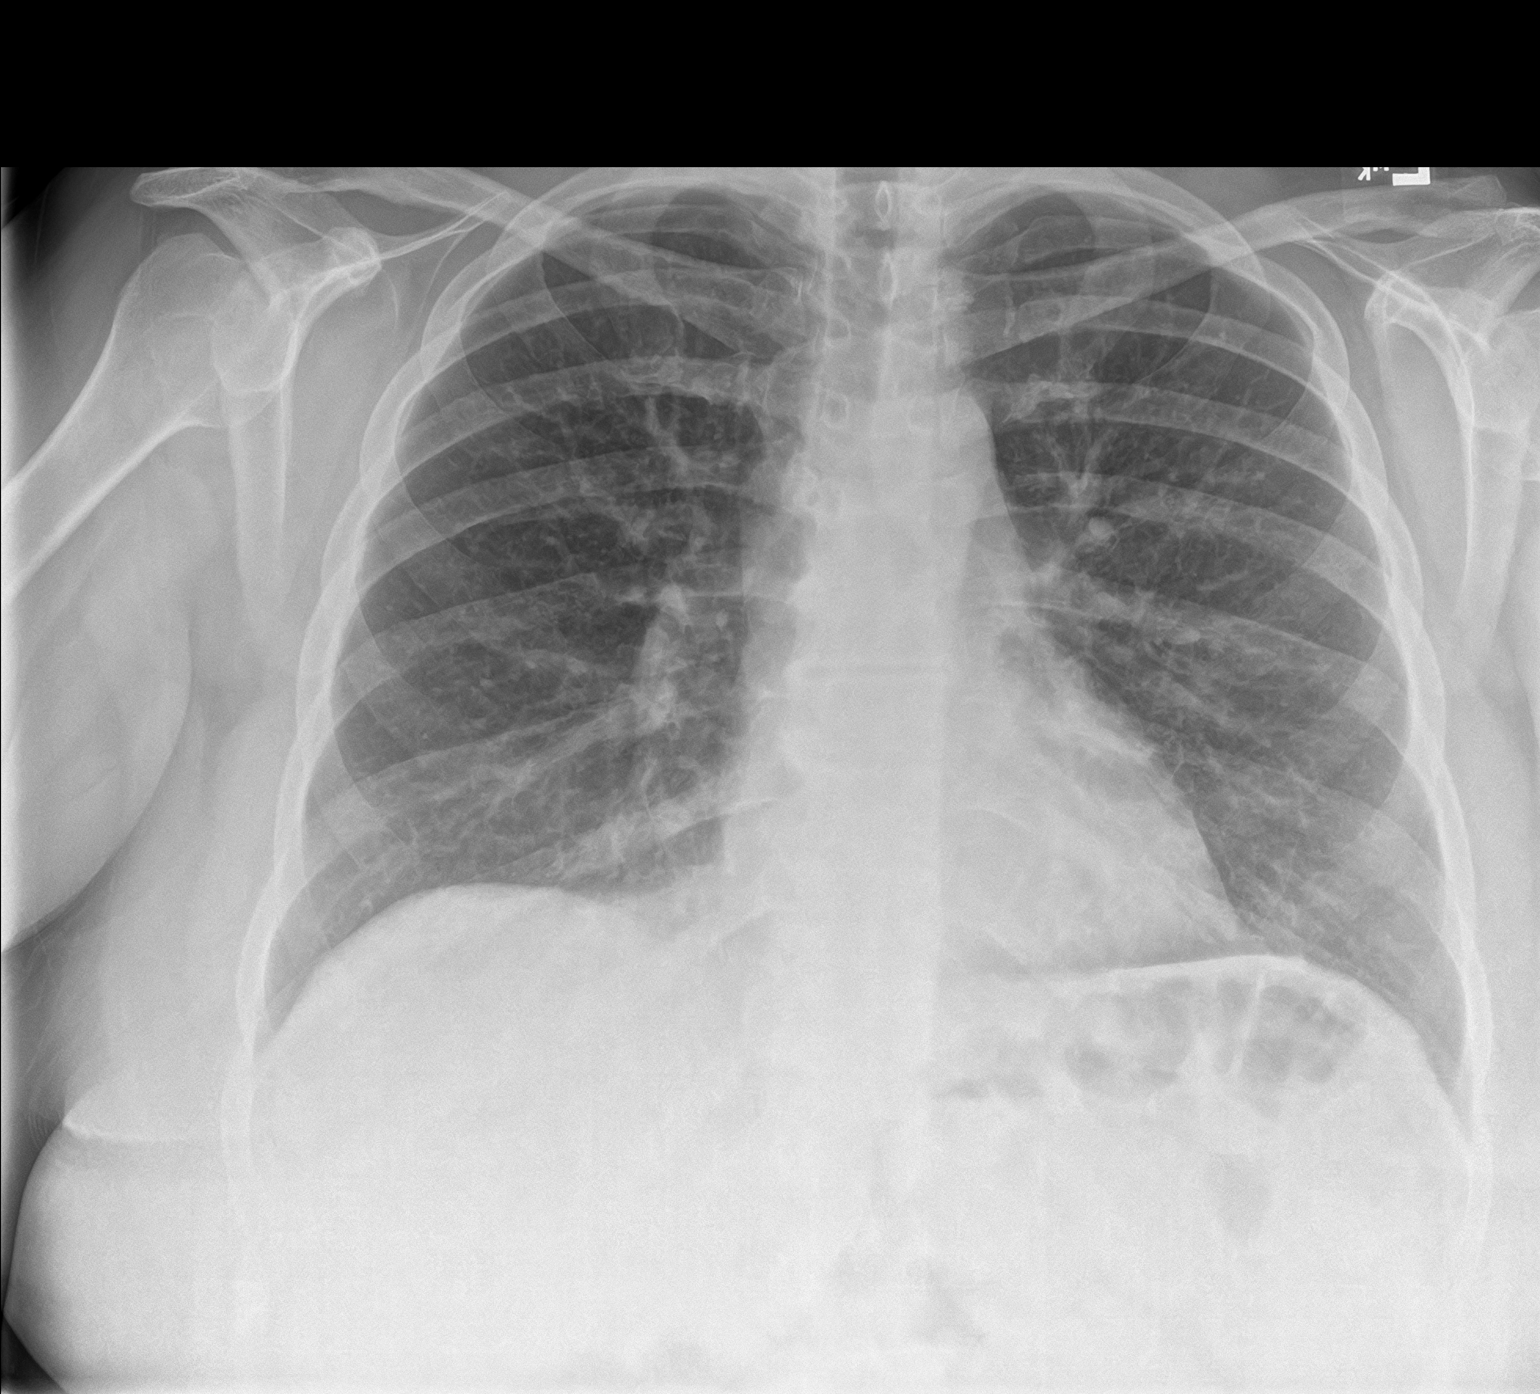
[im 2/2]
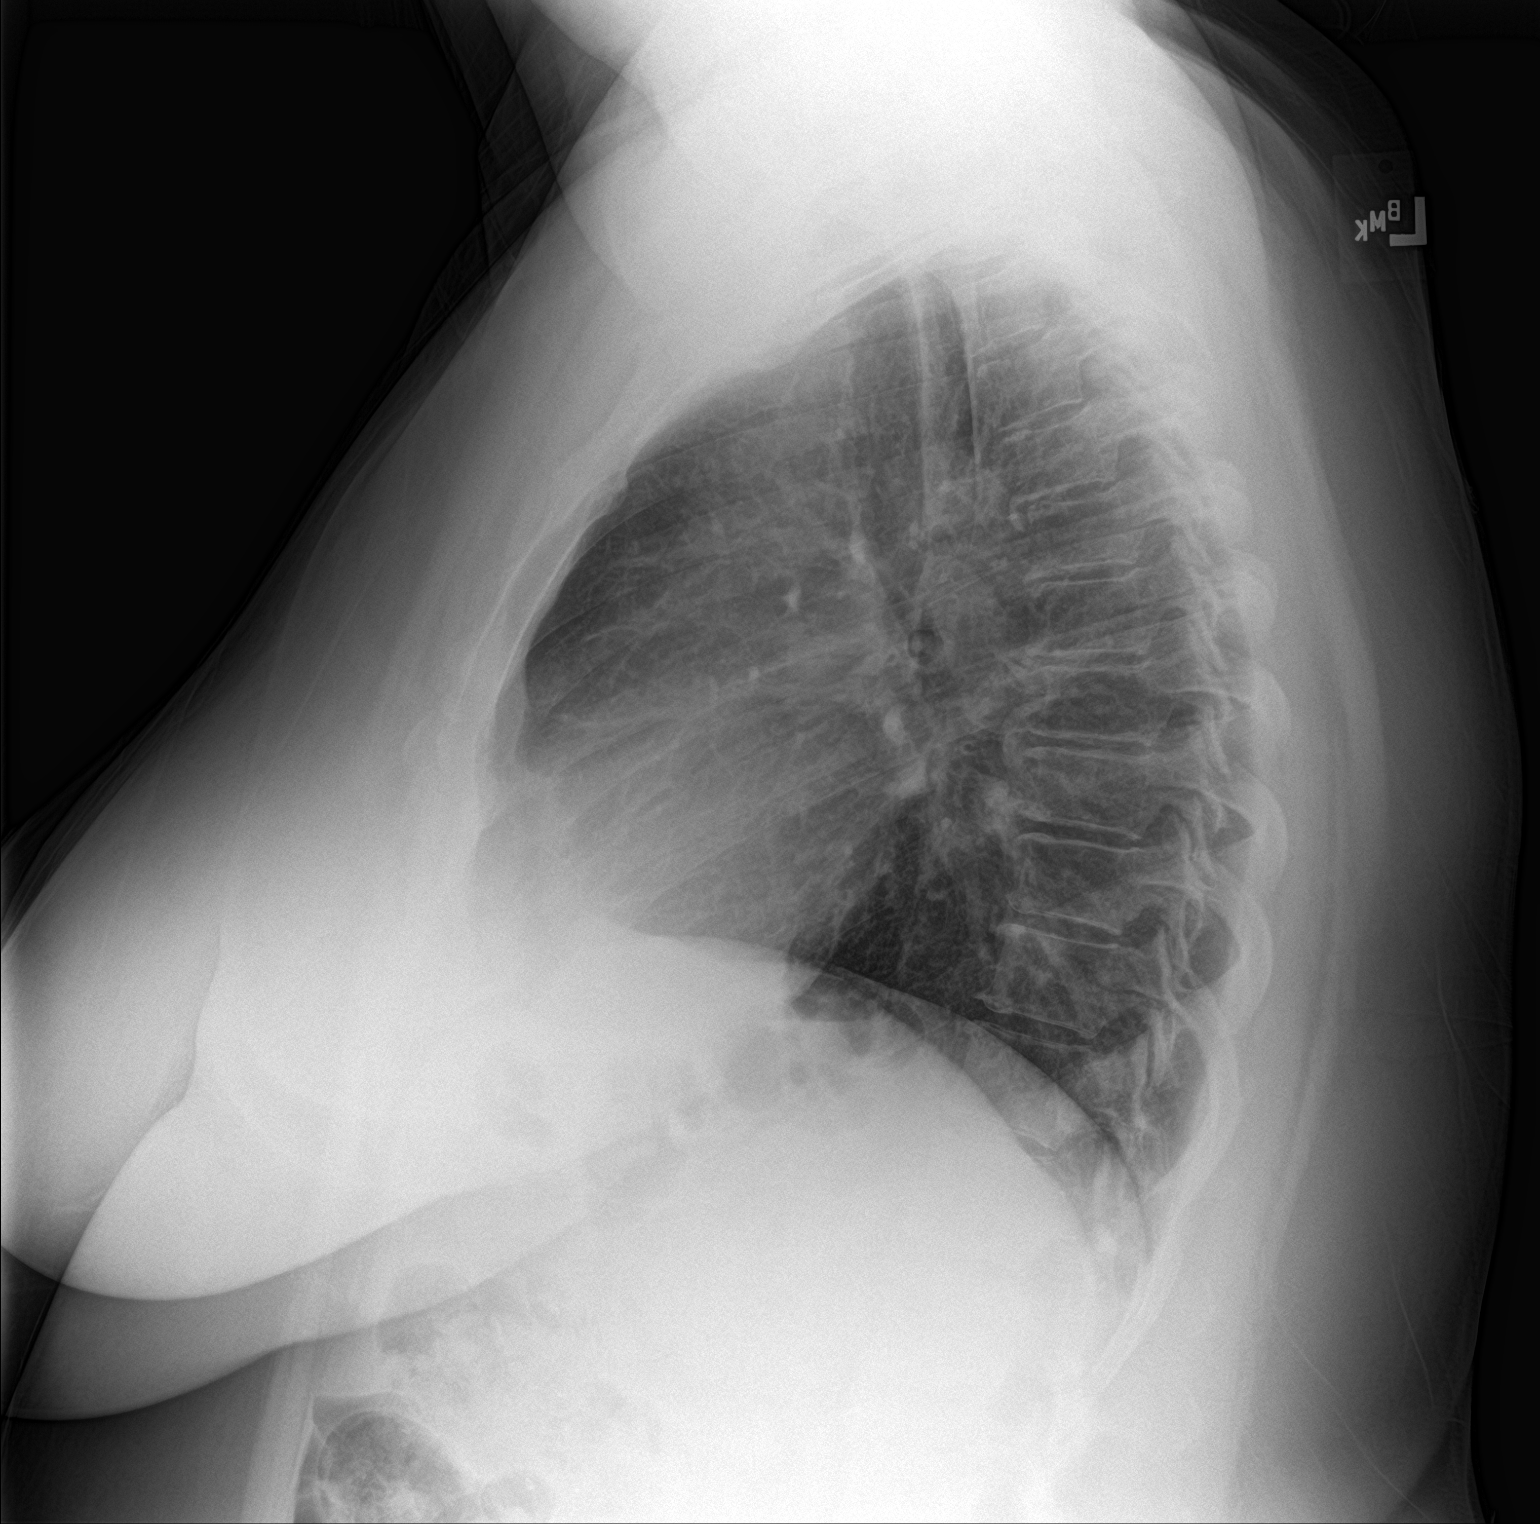

[2 of 2 positions shown; findings below may reference images not displayed]

FINDINGS: The cardiomediastinal contours are normal. Mild chronic
peribronchial thickening. Pulmonary vasculature is normal. No
consolidation, pleural effusion, or pneumothorax. No acute osseous
abnormalities are seen.
IMPRESSION: No acute chest findings. Mild chronic peribronchial thickening.

## 2021-07-15 ENCOUNTER — Ambulatory Visit: Payer: Self-pay | Admitting: *Deleted

## 2021-07-15 NOTE — Telephone Encounter (Signed)
2nd attempt to contact patient to review depression sx. No answer, phone rang multiple times with no answer. Unable to leave message to call back.  ?

## 2021-07-15 NOTE — Telephone Encounter (Signed)
3rd attempt to contact patient to review sx of depression per friend.  No answer, phone rang and unable to leave message.  ?

## 2021-07-15 NOTE — Telephone Encounter (Signed)
Summary: Depression  ? Pt partner/wife called in stated pt is going through a change of life and does not want to be in her own body right now. Amber mentioned pt is depressed while transferring to NT call dropped.  ? ?Amber stated that she is not with pt because pt is currently at work.   ? ?Seeking clinical advice   ?  ? ? ?Called patient's friend Terri Conner [phone] to review information submitted for patient, no answer, no voicemail box has not been set up. Unable to leave message. Called patient at [phone], no answer, phone rang multiple times no option to leave voicemail.  ?

## 2021-12-08 ENCOUNTER — Emergency Department
Admission: EM | Admit: 2021-12-08 | Discharge: 2021-12-09 | Discharge status: Against Medical Advice | Payer: Self-pay | Attending: Student | Admitting: Student

## 2021-12-08 ENCOUNTER — Emergency Department: Payer: Self-pay

## 2021-12-08 ENCOUNTER — Other Ambulatory Visit: Payer: Self-pay

## 2021-12-08 DIAGNOSIS — R002 Palpitations: Secondary | ICD-10-CM | POA: Insufficient documentation

## 2021-12-08 DIAGNOSIS — R0602 Shortness of breath: Secondary | ICD-10-CM | POA: Diagnosis not present

## 2021-12-08 DIAGNOSIS — R55 Syncope and collapse: Secondary | ICD-10-CM | POA: Insufficient documentation

## 2021-12-08 DIAGNOSIS — Z5321 Procedure and treatment not carried out due to patient leaving prior to being seen by health care provider: Secondary | ICD-10-CM | POA: Insufficient documentation

## 2021-12-08 DIAGNOSIS — Z87891 Personal history of nicotine dependence: Secondary | ICD-10-CM | POA: Diagnosis not present

## 2021-12-08 DIAGNOSIS — R079 Chest pain, unspecified: Secondary | ICD-10-CM | POA: Insufficient documentation

## 2021-12-08 LAB — CBC WITH DIFFERENTIAL/PLATELET
Abs Immature Granulocytes: 0.02 10*3/uL (ref 0.00–0.07)
Basophils Absolute: 0.1 10*3/uL (ref 0.0–0.1)
Basophils Relative: 2 %
Eosinophils Absolute: 0.1 10*3/uL (ref 0.0–0.5)
Eosinophils Relative: 1 %
HCT: 33.3 % — ABNORMAL LOW (ref 36.0–46.0)
Hemoglobin: 8.9 g/dL — ABNORMAL LOW (ref 12.0–15.0)
Immature Granulocytes: 0 %
Lymphocytes Relative: 35 %
Lymphs Abs: 2.6 10*3/uL (ref 0.7–4.0)
MCH: 17.4 pg — ABNORMAL LOW (ref 26.0–34.0)
MCHC: 26.7 g/dL — ABNORMAL LOW (ref 30.0–36.0)
MCV: 65.2 fL — ABNORMAL LOW (ref 80.0–100.0)
Monocytes Absolute: 0.6 10*3/uL (ref 0.1–1.0)
Monocytes Relative: 7 %
Neutro Abs: 4.2 10*3/uL (ref 1.7–7.7)
Neutrophils Relative %: 55 %
Platelets: 177 10*3/uL (ref 150–400)
RBC: 5.11 MIL/uL (ref 3.87–5.11)
RDW: 19.7 % — ABNORMAL HIGH (ref 11.5–15.5)
Smear Review: NORMAL
WBC: 7.7 10*3/uL (ref 4.0–10.5)
nRBC: 0 % (ref 0.0–0.2)

## 2021-12-08 LAB — BASIC METABOLIC PANEL
Anion gap: 3 — ABNORMAL LOW (ref 5–15)
BUN: 20 mg/dL (ref 6–20)
CO2: 26 mmol/L (ref 22–32)
Calcium: 9 mg/dL (ref 8.9–10.3)
Chloride: 107 mmol/L (ref 98–111)
Creatinine, Ser: 0.94 mg/dL (ref 0.44–1.00)
GFR, Estimated: >60 mL/min (ref >60)
Glucose, Bld: 76 mg/dL (ref 70–99)
Potassium: 4.3 mmol/L (ref 3.5–5.1)
Sodium: 136 mmol/L (ref 135–145)

## 2021-12-08 LAB — MAGNESIUM: Magnesium: 2.1 mg/dL (ref 1.7–2.4)

## 2021-12-08 LAB — TROPONIN I (HIGH SENSITIVITY): Troponin I (High Sensitivity): 3 ng/L (ref <18)

## 2021-12-08 LAB — TSH: TSH: 5.315 u[IU]/mL — ABNORMAL HIGH (ref 0.350–4.500)

## 2021-12-08 NOTE — ED Triage Notes (Signed)
Pt presents to ER via ems from home with c/o chest pain, sob, and palpitations that started a couple days ago.  Pt states these palpitations are common for her, but today they are worse than normal.  Pt states she states she feels like she is having trouble breathing, and is going to pass out.  Pt also endorsing a severe HA.  Pt denies any cardiac hx.  Pt is A&O x4 at this time in NAD in triage.

## 2021-12-08 NOTE — ED Notes (Signed)
Pt also states she quit smoking cigarettes cold [place] 3 weeks ago.

## 2021-12-08 NOTE — ED Provider Triage Note (Signed)
Emergency Medicine Provider Triage Evaluation Note  Terri Conner , a 51 y.o. female  was evaluated in triage.  Pt complains of palpitations. Feels SOB. Has happened before but she ignores it. Feels pre-syncopal but has not syncopized. Quit smoking 3 weeks ago.  Review of Systems  Positive: Palpitations, sob, cp Negative: Abd pain, n/v/d  Physical Exam  There were no vitals taken for this visit. Gen:   Awake, no distress   Resp:  Normal effort  MSK:   Moves extremities without difficulty  Other:    Medical Decision Making  Medically screening exam initiated at 7:21 PM.  Appropriate orders placed.  Terri Conner was informed that the remainder of the evaluation will be completed by another provider, this initial triage assessment does not replace that evaluation, and the importance of remaining in the ED until their evaluation is complete.     Jackelyn Hoehn, PA-C 12/08/21 1928

## 2021-12-09 ENCOUNTER — Telehealth: Payer: Self-pay | Admitting: Emergency Medicine

## 2021-12-09 NOTE — Telephone Encounter (Signed)
Called patient due to left emergency department before provider exam to inquire about condition and follow up plans. Left message. 

## 2021-12-12 ENCOUNTER — Telehealth: Payer: Self-pay | Admitting: Emergency Medicine

## 2022-03-08 ENCOUNTER — Ambulatory Visit (INDEPENDENT_AMBULATORY_CARE_PROVIDER_SITE_OTHER): Payer: Medicaid Other | Admitting: Internal Medicine

## 2022-03-08 ENCOUNTER — Ambulatory Visit
Admission: RE | Admit: 2022-03-08 | Discharge: 2022-03-08 | Disposition: A | Discharge status: Home / Self Care | Payer: Medicaid Other | Attending: Internal Medicine | Admitting: Internal Medicine

## 2022-03-08 ENCOUNTER — Encounter: Payer: Self-pay | Admitting: Internal Medicine

## 2022-03-08 ENCOUNTER — Ambulatory Visit
Admission: RE | Admit: 2022-03-08 | Discharge: 2022-03-08 | Disposition: A | Discharge status: Home / Self Care | Payer: Medicaid Other | Source: Ambulatory Visit | Attending: Internal Medicine | Admitting: Internal Medicine

## 2022-03-08 VITALS — BP 136/76 | HR 72 | Temp 96.9°F | Ht 63.0 in | Wt 262.0 lb

## 2022-03-08 DIAGNOSIS — R7989 Other specified abnormal findings of blood chemistry: Secondary | ICD-10-CM

## 2022-03-08 DIAGNOSIS — Z114 Encounter for screening for human immunodeficiency virus [HIV]: Secondary | ICD-10-CM | POA: Diagnosis not present

## 2022-03-08 DIAGNOSIS — M5442 Lumbago with sciatica, left side: Secondary | ICD-10-CM

## 2022-03-08 DIAGNOSIS — D508 Other iron deficiency anemias: Secondary | ICD-10-CM | POA: Insufficient documentation

## 2022-03-08 DIAGNOSIS — G43019 Migraine without aura, intractable, without status migrainosus: Secondary | ICD-10-CM | POA: Diagnosis not present

## 2022-03-08 DIAGNOSIS — Z23 Encounter for immunization: Secondary | ICD-10-CM

## 2022-03-08 DIAGNOSIS — M1711 Unilateral primary osteoarthritis, right knee: Secondary | ICD-10-CM | POA: Diagnosis not present

## 2022-03-08 DIAGNOSIS — Z6841 Body Mass Index (BMI) 40.0 and over, adult: Secondary | ICD-10-CM

## 2022-03-08 DIAGNOSIS — M25552 Pain in left hip: Secondary | ICD-10-CM

## 2022-03-08 DIAGNOSIS — G43909 Migraine, unspecified, not intractable, without status migrainosus: Secondary | ICD-10-CM | POA: Insufficient documentation

## 2022-03-08 DIAGNOSIS — G8929 Other chronic pain: Secondary | ICD-10-CM

## 2022-03-08 DIAGNOSIS — M25562 Pain in left knee: Secondary | ICD-10-CM | POA: Diagnosis not present

## 2022-03-08 DIAGNOSIS — M5136 Other intervertebral disc degeneration, lumbar region: Secondary | ICD-10-CM | POA: Diagnosis not present

## 2022-03-08 DIAGNOSIS — Z1159 Encounter for screening for other viral diseases: Secondary | ICD-10-CM | POA: Diagnosis not present

## 2022-03-08 DIAGNOSIS — F32A Depression, unspecified: Secondary | ICD-10-CM

## 2022-03-08 DIAGNOSIS — Z136 Encounter for screening for cardiovascular disorders: Secondary | ICD-10-CM

## 2022-03-08 DIAGNOSIS — Z124 Encounter for screening for malignant neoplasm of cervix: Secondary | ICD-10-CM | POA: Diagnosis not present

## 2022-03-08 DIAGNOSIS — E6609 Other obesity due to excess calories: Secondary | ICD-10-CM | POA: Insufficient documentation

## 2022-03-08 DIAGNOSIS — R7309 Other abnormal glucose: Secondary | ICD-10-CM | POA: Diagnosis not present

## 2022-03-08 DIAGNOSIS — M170 Bilateral primary osteoarthritis of knee: Secondary | ICD-10-CM | POA: Diagnosis not present

## 2022-03-08 DIAGNOSIS — M25561 Pain in right knee: Secondary | ICD-10-CM

## 2022-03-08 DIAGNOSIS — F419 Anxiety disorder, unspecified: Secondary | ICD-10-CM | POA: Diagnosis not present

## 2022-03-08 NOTE — Assessment & Plan Note (Signed)
Not medicated Support offered 

## 2022-03-08 NOTE — Addendum Note (Signed)
Addended by: Kavin Leech E on: 03/08/2022 11:13 AM   Modules accepted: Orders

## 2022-03-08 NOTE — Assessment & Plan Note (Signed)
X-ray bilateral knees today Encourage weight loss as this can help reduce joint pain

## 2022-03-08 NOTE — Assessment & Plan Note (Signed)
Encourage diet and exercise for weight loss 

## 2022-03-08 NOTE — Assessment & Plan Note (Signed)
X-ray lumbar spine today Encourage exercise for weight loss and core strengthening as this can help reduce back pain

## 2022-03-08 NOTE — Progress Notes (Signed)
HPI  Pt presents to the clinic today establish care and for management of the conditions listed below.  Anxiety and Depression: Worse since going through menopause.  She is not currently taking any medications for this.  She does not see a therapist.  She denies SI/HI.  Migraines: These occur about 1 x month. She is not sure what triggers this. She takes Ibuprofen as needed with some relief of symptoms. She lays down in a dark room and the headache finally resolves.  Iron Deficiency Anemia: Her last H/H was 8.9/33.3, 12/2021.  She is not taking any iron at this time.  She is status post gastric bypass.  She also reports low back pain, left hip pain, and bilateral knee pain. Her left is worse than the right. This started years ago but got worse in the last few years. She describes the pain as sharp and stabbing but can be sore and achy depending on what she is doing. She reports associated stiffness and popping. She reports her right knee will give out on her at times. She reports intermittent numbness in her left thigh but denies tingling or weakness. She has had multiple falls in which she landed on her knees.  Past Medical History:  Diagnosis Date   Migraines     Current Outpatient Medications  Medication Sig Dispense Refill   ibuprofen (ADVIL) 200 MG tablet Take 200 mg by mouth every 6 (six) hours as needed.     albuterol (VENTOLIN HFA) 108 (90 Base) MCG/ACT inhaler Inhale 2 puffs into the lungs every 4 (four) hours as needed for wheezing or shortness of breath. 8 g 1   cyclobenzaprine (FLEXERIL) 10 MG tablet Take 1 tablet (10 mg total) by mouth 3 (three) times daily as needed. 30 tablet 0   doxycycline (VIBRAMYCIN) 100 MG capsule Take 1 capsule (100 mg total) by mouth 2 (two) times daily. 14 capsule 0   predniSONE (STERAPRED UNI-PAK 21 TAB) 10 MG (21) TBPK tablet Take 6 tablets on the first day and decrease by 1 tablet each day until finished. 21 tablet 0   No current  facility-administered medications for this visit.    No Known Allergies  Family History  Problem Relation Age of Onset   Anxiety disorder Mother    Depression Mother    Heart attack Father 2   Alcohol abuse Father    Alcohol abuse Sister    Lung cancer Maternal Grandmother     Social History   Socioeconomic History   Marital status: Legally Separated    Spouse name: Not on file   Number of children: Not on file   Years of education: Not on file   Highest education level: Not on file  Occupational History   Not on file  Tobacco Use   Smoking status: Every Day    Packs/day: 1.00    Years: 18.00    Total pack years: 18.00    Types: Cigarettes   Smokeless tobacco: Never  Vaping Use   Vaping Use: Never used  Substance and Sexual Activity   Alcohol use: Yes    Comment: social   Drug use: Not Currently   Sexual activity: Not on file  Other Topics Concern   Not on file  Social History Narrative   Not on file   Social Determinants of Health   Financial Resource Strain: Not on file  Food Insecurity: Not on file  Transportation Needs: Not on file  Physical Activity: Not on file  Stress: Not  on file  Social Connections: Not on file  Intimate Partner Violence: Not on file    ROS:  Constitutional: Patient reports intermittent headaches.  Denies fever, malaise, fatigue, or abrupt weight changes.  HEENT: Denies eye pain, eye redness, ear pain, ringing in the ears, wax buildup, runny nose, nasal congestion, bloody nose, or sore throat. Respiratory: Patient reports nocturnal cough.  Denies difficulty breathing, shortness of breath, or sputum production.   Cardiovascular: Denies chest pain, chest tightness, palpitations or swelling in the hands or feet.  Gastrointestinal: Denies abdominal pain, bloating, constipation, diarrhea or blood in the stool.  GU: Denies frequency, urgency, pain with urination, blood in urine, odor or discharge. Musculoskeletal: Patient reports  chronic low back pain, left hip pain and bilateral knee pain.  Denies decrease in range of motion, difficulty with gait, muscle pain or joint swelling.  Skin: Denies redness, rashes, lesions or ulcercations.  Neurological: Patient reports numbness in her left thigh.  Denies dizziness, difficulty with memory, difficulty with speech or problems with balance and coordination.  Psych: Patient has a history of anxiety and depression.  Denies SI/HI.  No other specific complaints in a complete review of systems (except as listed in HPI above).  PE:  BP 136/76 (BP Location: Left Arm, Patient Position: Sitting, Cuff Size: Large)   Pulse 72   Temp (!) 96.9 F (36.1 C) (Temporal)   Ht 5\' 3"  (1.6 m)   Wt 262 lb (118.8 kg)   SpO2 98%   BMI 46.41 kg/m  Wt Readings from Last 3 Encounters:  03/08/22 262 lb (118.8 kg)  12/08/21 250 lb (113.4 kg)  10/14/19 240 lb (108.9 kg)    General: Appears her stated age, obese in NAD. HEENT: Head: normal shape and size; Eyes: sclera white, no icterus, conjunctiva pink, PERRLA and EOMs intact;  Neck: Neck supple, trachea midline. No masses, lumps or thyromegaly present.  Cardiovascular: Normal rate and rhythm. S1,S2 noted.  No murmur, rubs or gallops noted. No JVD or BLE edema. No carotid bruits noted. Pulmonary/Chest: Normal effort and positive vesicular breath sounds. No respiratory distress. No wheezes, rales or ronchi noted.  Musculoskeletal: Normal flexion, extension, rotation and lateral bending of the spine.  No bony tenderness noted over the spine.  Normal flexion, extension, abduction, abduction, internal and external rotation of the left hip.  No pain with palpation of the left hip.  Normal flexion and extension of bilateral knees.  Crepitus noted with range of motion of the knees.  Pain with palpation along bilateral medial joint lines.  Strength 5/5 BLE.  Gait slow and steady without device. Neurological: Alert and oriented. Coordination normal.   Psychiatric: Mood and affect normal. Behavior is normal. Judgment and thought content normal.    BMET    Component Value Date/Time   NA 136 12/08/2021 1928   K 4.3 12/08/2021 1928   CL 107 12/08/2021 1928   CO2 26 12/08/2021 1928   GLUCOSE 76 12/08/2021 1928   BUN 20 12/08/2021 1928   CREATININE 0.94 12/08/2021 1928   CALCIUM 9.0 12/08/2021 1928   GFRNONAA >60 12/08/2021 1928   GFRAA >60 10/14/2019 1233    Lipid Panel  No results found for: "CHOL", "TRIG", "HDL", "CHOLHDL", "VLDL", "LDLCALC"  CBC    Component Value Date/Time   WBC 7.7 12/08/2021 1928   RBC 5.11 12/08/2021 1928   HGB 8.9 (L) 12/08/2021 1928   HCT 33.3 (L) 12/08/2021 1928   PLT 177 12/08/2021 1928   MCV 65.2 (L) 12/08/2021 1928  MCH 17.4 (L) 12/08/2021 1928   MCHC 26.7 (L) 12/08/2021 1928   RDW 19.7 (H) 12/08/2021 1928   LYMPHSABS 2.6 12/08/2021 1928   MONOABS 0.6 12/08/2021 1928   EOSABS 0.1 12/08/2021 1928   BASOSABS 0.1 12/08/2021 1928    Hgb A1C No results found for: "HGBA1C"   Assessment and Plan:    RTC in 6 months for follow up of chronic conditions Nicki Reaper, NP

## 2022-03-08 NOTE — Assessment & Plan Note (Signed)
X-ray left hip today Encourage weight loss as this can reduce joint pain

## 2022-03-08 NOTE — Assessment & Plan Note (Signed)
CBC and iron panel today Consider referral to hematology as she is status post gastric bypass and will not absorb oral iron

## 2022-03-08 NOTE — Assessment & Plan Note (Signed)
Try to identify and avoid triggers Continue ibuprofen OTC as needed

## 2022-03-09 ENCOUNTER — Telehealth: Payer: Self-pay | Admitting: Internal Medicine

## 2022-03-09 DIAGNOSIS — Z9884 Bariatric surgery status: Secondary | ICD-10-CM

## 2022-03-09 DIAGNOSIS — D508 Other iron deficiency anemias: Secondary | ICD-10-CM

## 2022-03-09 LAB — COMPLETE METABOLIC PANEL WITH GFR
AG Ratio: 1.5 (calc) (ref 1.0–2.5)
ALT: 10 U/L (ref 6–29)
AST: 13 U/L (ref 10–35)
Albumin: 4 g/dL (ref 3.6–5.1)
Alkaline phosphatase (APISO): 90 U/L (ref 37–153)
BUN: 14 mg/dL (ref 7–25)
CO2: 29 mmol/L (ref 20–32)
Calcium: 9.4 mg/dL (ref 8.6–10.4)
Chloride: 108 mmol/L (ref 98–110)
Creat: 0.65 mg/dL (ref 0.50–1.03)
Globulin: 2.7 g/dL (calc) (ref 1.9–3.7)
Glucose, Bld: 90 mg/dL (ref 65–99)
Potassium: 4.7 mmol/L (ref 3.5–5.3)
Sodium: 141 mmol/L (ref 135–146)
Total Bilirubin: 0.4 mg/dL (ref 0.2–1.2)
Total Protein: 6.7 g/dL (ref 6.1–8.1)
eGFR: 106 mL/min/{1.73_m2} (ref >=60)

## 2022-03-09 LAB — LIPID PANEL
Cholesterol: 100 mg/dL (ref <200)
HDL: 38 mg/dL — ABNORMAL LOW (ref >=50)
LDL Cholesterol (Calc): 45 mg/dL (calc)
Non-HDL Cholesterol (Calc): 62 mg/dL (calc) (ref <130)
Total CHOL/HDL Ratio: 2.6 (calc) (ref <5.0)
Triglycerides: 88 mg/dL (ref <150)

## 2022-03-09 LAB — HEPATITIS C ANTIBODY: Hepatitis C Ab: NONREACTIVE

## 2022-03-09 LAB — CBC
HCT: 33.3 % — ABNORMAL LOW (ref 35.0–45.0)
Hemoglobin: 8.9 g/dL — ABNORMAL LOW (ref 11.7–15.5)
MCH: 17.1 pg — ABNORMAL LOW (ref 27.0–33.0)
MCHC: 26.7 g/dL — ABNORMAL LOW (ref 32.0–36.0)
MCV: 63.9 fL — ABNORMAL LOW (ref 80.0–100.0)
Platelets: 197 10*3/uL (ref 140–400)
RBC: 5.21 10*6/uL — ABNORMAL HIGH (ref 3.80–5.10)
RDW: 17.5 % — ABNORMAL HIGH (ref 11.0–15.0)
WBC: 5.4 10*3/uL (ref 3.8–10.8)

## 2022-03-09 LAB — HEMOGLOBIN A1C
Hgb A1c MFr Bld: 5.4 % of total Hgb (ref <5.7)
Mean Plasma Glucose: 108 mg/dL
eAG (mmol/L): 6 mmol/L

## 2022-03-09 LAB — IRON,TIBC AND FERRITIN PANEL
%SAT: 3 % (calc) — ABNORMAL LOW (ref 16–45)
Ferritin: 1 ng/mL — ABNORMAL LOW (ref 16–232)
Iron: 16 ug/dL — ABNORMAL LOW (ref 45–160)
TIBC: 478 mcg/dL (calc) — ABNORMAL HIGH (ref 250–450)

## 2022-03-09 LAB — TSH: TSH: 1.68 mIU/L

## 2022-03-09 LAB — HIV ANTIBODY (ROUTINE TESTING W REFLEX): HIV 1&2 Ab, 4th Generation: NONREACTIVE

## 2022-03-09 LAB — T4, FREE: Free T4: 1 ng/dL (ref 0.8–1.8)

## 2022-03-09 NOTE — Telephone Encounter (Signed)
Copied from CRM [id number]. Topic: Referral - Status >> Mar 09, 2022  2:42 PM Tiffany B wrote:  Reason for CRM: Patient responding to PCP lab results / notes and would like PCP to refer her to a hematologist.   Patient stated she expressed to PCP she is unable to sleep and would like PCP to prescribe a medication unable to sleep and would like PCP to prescribe

## 2022-03-10 MED ORDER — HYDROXYZINE PAMOATE 25 MG PO CAPS: 25.0000 mg | ORAL_CAPSULE | Freq: Every evening | ORAL | 0 refills | Status: DC | PRN

## 2022-03-10 NOTE — Telephone Encounter (Signed)
Referral to hematology placed.  Hydroxyzine ordered for insomnia.

## 2022-03-10 NOTE — Addendum Note (Signed)
Addended by: Lorre Munroe on: 03/10/2022 08:01 AM   Modules accepted: Orders

## 2022-03-16 ENCOUNTER — Inpatient Hospital Stay: Payer: Medicaid Other

## 2022-03-16 ENCOUNTER — Inpatient Hospital Stay: Payer: Medicaid Other | Admitting: Oncology

## 2022-03-24 ENCOUNTER — Inpatient Hospital Stay: Payer: Medicaid Other

## 2022-03-24 ENCOUNTER — Encounter: Payer: Medicaid Other | Admitting: Licensed Practical Nurse

## 2022-03-24 ENCOUNTER — Inpatient Hospital Stay: Payer: Medicaid Other | Admitting: Oncology

## 2022-04-07 ENCOUNTER — Inpatient Hospital Stay: Payer: Medicaid Other

## 2022-04-07 ENCOUNTER — Inpatient Hospital Stay: Payer: Medicaid Other | Attending: Oncology | Admitting: Oncology

## 2022-04-11 ENCOUNTER — Encounter: Payer: Self-pay | Admitting: Internal Medicine

## 2022-04-12 ENCOUNTER — Telehealth (INDEPENDENT_AMBULATORY_CARE_PROVIDER_SITE_OTHER): Payer: Medicaid Other | Admitting: Internal Medicine

## 2022-04-12 ENCOUNTER — Encounter: Payer: Self-pay | Admitting: Emergency Medicine

## 2022-04-12 ENCOUNTER — Emergency Department: Payer: Medicaid Other

## 2022-04-12 ENCOUNTER — Encounter: Payer: Self-pay | Admitting: Internal Medicine

## 2022-04-12 ENCOUNTER — Emergency Department
Admission: EM | Admit: 2022-04-12 | Discharge: 2022-04-12 | Discharge status: Against Medical Advice | Payer: Medicaid Other | Attending: Emergency Medicine | Admitting: Emergency Medicine

## 2022-04-12 DIAGNOSIS — F5101 Primary insomnia: Secondary | ICD-10-CM | POA: Diagnosis not present

## 2022-04-12 DIAGNOSIS — Z1152 Encounter for screening for COVID-19: Secondary | ICD-10-CM | POA: Diagnosis not present

## 2022-04-12 DIAGNOSIS — R457 State of emotional shock and stress, unspecified: Secondary | ICD-10-CM | POA: Diagnosis not present

## 2022-04-12 DIAGNOSIS — R0789 Other chest pain: Secondary | ICD-10-CM | POA: Diagnosis not present

## 2022-04-12 DIAGNOSIS — Z5321 Procedure and treatment not carried out due to patient leaving prior to being seen by health care provider: Secondary | ICD-10-CM | POA: Insufficient documentation

## 2022-04-12 DIAGNOSIS — R079 Chest pain, unspecified: Secondary | ICD-10-CM | POA: Insufficient documentation

## 2022-04-12 DIAGNOSIS — R42 Dizziness and giddiness: Secondary | ICD-10-CM | POA: Insufficient documentation

## 2022-04-12 DIAGNOSIS — R531 Weakness: Secondary | ICD-10-CM | POA: Diagnosis not present

## 2022-04-12 DIAGNOSIS — G4700 Insomnia, unspecified: Secondary | ICD-10-CM | POA: Insufficient documentation

## 2022-04-12 DIAGNOSIS — I959 Hypotension, unspecified: Secondary | ICD-10-CM | POA: Diagnosis not present

## 2022-04-12 LAB — CBC
HCT: 34.4 % — ABNORMAL LOW (ref 36.0–46.0)
Hemoglobin: 9 g/dL — ABNORMAL LOW (ref 12.0–15.0)
MCH: 16.7 pg — ABNORMAL LOW (ref 26.0–34.0)
MCHC: 26.2 g/dL — ABNORMAL LOW (ref 30.0–36.0)
MCV: 63.7 fL — ABNORMAL LOW (ref 80.0–100.0)
Platelets: 197 10*3/uL (ref 150–400)
RBC: 5.4 MIL/uL — ABNORMAL HIGH (ref 3.87–5.11)
RDW: 19.8 % — ABNORMAL HIGH (ref 11.5–15.5)
WBC: 8.3 10*3/uL (ref 4.0–10.5)
nRBC: 0 % (ref 0.0–0.2)

## 2022-04-12 LAB — BASIC METABOLIC PANEL
Anion gap: 6 (ref 5–15)
BUN: 14 mg/dL (ref 6–20)
CO2: 25 mmol/L (ref 22–32)
Calcium: 8.8 mg/dL — ABNORMAL LOW (ref 8.9–10.3)
Chloride: 105 mmol/L (ref 98–111)
Creatinine, Ser: 0.77 mg/dL (ref 0.44–1.00)
GFR, Estimated: >60 mL/min (ref >60)
Glucose, Bld: 108 mg/dL — ABNORMAL HIGH (ref 70–99)
Potassium: 4 mmol/L (ref 3.5–5.1)
Sodium: 136 mmol/L (ref 135–145)

## 2022-04-12 LAB — RESP PANEL BY RT-PCR (RSV, FLU A&B, COVID)  RVPGX2
Influenza A by PCR: NEGATIVE
Influenza B by PCR: NEGATIVE
Resp Syncytial Virus by PCR: NEGATIVE
SARS Coronavirus 2 by RT PCR: NEGATIVE

## 2022-04-12 LAB — TROPONIN I (HIGH SENSITIVITY): Troponin I (High Sensitivity): 3 ng/L (ref <18)

## 2022-04-12 MED ORDER — ONDANSETRON HCL 4 MG/2ML IJ SOLN
4.0000 mg | Freq: Once | INTRAMUSCULAR | Status: AC
  Administered 2022-04-12: 4 mg via INTRAVENOUS
  Filled 2022-04-12: qty 2

## 2022-04-12 MED ORDER — TRAZODONE HCL 50 MG PO TABS: 25.0000 mg | ORAL_TABLET | Freq: Every evening | ORAL | 3 refills | Status: DC | PRN

## 2022-04-12 NOTE — Patient Instructions (Signed)
Insomnia Insomnia is a sleep disorder that makes it difficult to fall asleep or stay asleep. Insomnia can cause fatigue, low energy, difficulty concentrating, mood swings, and poor performance at work or school. There are three different ways to classify insomnia: Difficulty falling asleep. Difficulty staying asleep. Waking up too early in the morning. Any type of insomnia can be long-term (chronic) or short-term (acute). Both are common. Short-term insomnia usually lasts for 3 months or less. Chronic insomnia occurs at least three times a week for longer than 3 months. What are the causes? Insomnia may be caused by another condition, situation, or substance, such as: Having certain mental health conditions, such as anxiety and depression. Using caffeine, alcohol, tobacco, or drugs. Having gastrointestinal conditions, such as gastroesophageal reflux disease (GERD). Having certain medical conditions. These include: Asthma. Alzheimer's disease. Stroke. Chronic pain. An overactive thyroid gland (hyperthyroidism). Other sleep disorders, such as restless legs syndrome and sleep apnea. Menopause. Sometimes, the cause of insomnia may not be known. What increases the risk? Risk factors for insomnia include: Gender. Females are affected more often than males. Age. Insomnia is more common as people get older. Stress and certain medical and mental health conditions. Lack of exercise. Having an irregular work schedule. This may include working night shifts and traveling between different time zones. What are the signs or symptoms? If you have insomnia, the main symptom is having trouble falling asleep or having trouble staying asleep. This may lead to other symptoms, such as: Feeling tired or having low energy. Feeling nervous about going to sleep. Not feeling rested in the morning. Having trouble concentrating. Feeling irritable, anxious, or depressed. How is this diagnosed? This condition  may be diagnosed based on: Your symptoms and medical history. Your health care provider may ask about: Your sleep habits. Any medical conditions you have. Your mental health. A physical exam. How is this treated? Treatment for insomnia depends on the cause. Treatment may focus on treating an underlying condition that is causing the insomnia. Treatment may also include: Medicines to help you sleep. Counseling or therapy. Lifestyle adjustments to help you sleep better. Follow these instructions at home: Eating and drinking  Limit or avoid alcohol, caffeinated beverages, and products that contain nicotine and tobacco, especially close to bedtime. These can disrupt your sleep. Do not eat a large meal or eat spicy foods right before bedtime. This can lead to digestive discomfort that can make it hard for you to sleep. Sleep habits  Keep a sleep diary to help you and your health care provider figure out what could be causing your insomnia. Write down: When you sleep. When you wake up during the night. How well you sleep and how rested you feel the next day. Any side effects of medicines you are taking. What you eat and drink. Make your bedroom a dark, comfortable place where it is easy to fall asleep. Put up shades or blackout curtains to block light from outside. Use a white noise machine to block noise. Keep the temperature cool. Limit screen use before bedtime. This includes: Not watching TV. Not using your smartphone, tablet, or computer. Stick to a routine that includes going to bed and waking up at the same times every day and night. This can help you fall asleep faster. Consider making a quiet activity, such as reading, part of your nighttime routine. Try to avoid taking naps during the day so that you sleep better at night. Get out of bed if you are still awake after   15 minutes of trying to sleep. Keep the lights down, but try reading or doing a quiet activity. When you feel  sleepy, go back to bed. General instructions Take over-the-counter and prescription medicines only as told by your health care provider. Exercise regularly as told by your health care provider. However, avoid exercising in the hours right before bedtime. Use relaxation techniques to manage stress. Ask your health care provider to suggest some techniques that may work well for you. These may include: Breathing exercises. Routines to release muscle tension. Visualizing peaceful scenes. Make sure that you drive carefully. Do not drive if you feel very sleepy. Keep all follow-up visits. This is important. Contact a health care provider if: You are tired throughout the day. You have trouble in your daily routine due to sleepiness. You continue to have sleep problems, or your sleep problems get worse. Get help right away if: You have thoughts about hurting yourself or someone else. Get help right away if you feel like you may hurt yourself or others, or have thoughts about taking your own life. Go to your nearest emergency room or: Call 911. Call the National Suicide Prevention Lifeline at 1-800-273-8255 or 988. This is open 24 hours a day. Text the Crisis Text Line at 741741. Summary Insomnia is a sleep disorder that makes it difficult to fall asleep or stay asleep. Insomnia can be long-term (chronic) or short-term (acute). Treatment for insomnia depends on the cause. Treatment may focus on treating an underlying condition that is causing the insomnia. Keep a sleep diary to help you and your health care provider figure out what could be causing your insomnia. This information is not intended to replace advice given to you by your health care provider. Make sure you discuss any questions you have with your health care provider. Document Revised: 03/01/2021 Document Reviewed: 03/01/2021 Elsevier Patient Education  2023 [employer]  

## 2022-04-12 NOTE — Assessment & Plan Note (Signed)
Will trial trazodone

## 2022-04-12 NOTE — Progress Notes (Signed)
Virtual Visit via Video Note  I connected with Terri Conner on 04/12/22 at  4:00 PM EST by a video enabled telemedicine application and verified that I am speaking with the correct person using two identifiers.  Location: Patient: In her car Provider: Office  Persons participating in this video call: Nicki Reaper, NP and [name]   I discussed the limitations of evaluation and management by telemedicine and the availability of in person appointments. The patient expressed understanding and agreed to proceed.  History of Present Illness:  Patient reports insomnia.  She reports she is having difficulty falling and sleeping alseep.  She has taken Hydroxyzine with minimal relief of symptoms.  There is no sleep study on file.   Past Medical History:  Diagnosis Date   Migraines     Current Outpatient Medications  Medication Sig Dispense Refill   hydrOXYzine (VISTARIL) 25 MG capsule Take 1 capsule (25 mg total) by mouth at bedtime as needed. 90 capsule 0   ibuprofen (ADVIL) 200 MG tablet Take 200 mg by mouth every 6 (six) hours as needed.     No current facility-administered medications for this visit.    No Known Allergies  Family History  Problem Relation Age of Onset   Anxiety disorder Mother    Depression Mother    Heart attack Father 34   Alcohol abuse Father    Alcohol abuse Sister    Lung cancer Maternal Grandmother     Social History   Socioeconomic History   Marital status: Married    Spouse name: Not on file   Number of children: Not on file   Years of education: Not on file   Highest education level: Not on file  Occupational History   Not on file  Tobacco Use   Smoking status: Every Day    Packs/day: 1.00    Years: 18.00    Total pack years: 18.00    Types: Cigarettes   Smokeless tobacco: Never  Vaping Use   Vaping Use: Never used  Substance and Sexual Activity   Alcohol use: Yes    Comment: social   Drug use: Not Currently   Sexual activity:  Not on file  Other Topics Concern   Not on file  Social History Narrative   Not on file   Social Determinants of Health   Financial Resource Strain: Not on file  Food Insecurity: Not on file  Transportation Needs: Not on file  Physical Activity: Not on file  Stress: Not on file  Social Connections: Not on file  Intimate Partner Violence: Not on file     Constitutional: Denies fever, malaise, fatigue, headache or abrupt weight changes.  Respiratory: Denies difficulty breathing, shortness of breath, cough or sputum production.   Cardiovascular: Denies chest pain, chest tightness, palpitations or swelling in the hands or feet.  Neurological: Patient reports insomnia.  Denies dizziness, difficulty with memory, difficulty with speech or problems with balance and coordination.  Psych: Patient has a history of anxiety and depression.  Denies anxiety, depression, SI/HI.  No other specific complaints in a complete review of systems (except as listed in HPI above).  Observations/Objective:   Wt Readings from Last 3 Encounters:  03/08/22 262 lb (118.8 kg)  12/08/21 250 lb (113.4 kg)  10/14/19 240 lb (108.9 kg)    General: Appears her stated age, obese in NAD. Pulmonary/Chest: Normal effort. No respiratory distress.  Neurological: Alert and oriented.  Psychiatric: Mood and affect normal. Behavior is normal. Judgment and  thought content normal.   BMET    Component Value Date/Time   NA 141 03/08/2022 1039   K 4.7 03/08/2022 1039   CL 108 03/08/2022 1039   CO2 29 03/08/2022 1039   GLUCOSE 90 03/08/2022 1039   BUN 14 03/08/2022 1039   CREATININE 0.65 03/08/2022 1039   CALCIUM 9.4 03/08/2022 1039   GFRNONAA >60 12/08/2021 1928   GFRAA >60 10/14/2019 1233    Lipid Panel     Component Value Date/Time   CHOL 100 03/08/2022 1039   TRIG 88 03/08/2022 1039   HDL 38 (L) 03/08/2022 1039   CHOLHDL 2.6 03/08/2022 1039   LDLCALC 45 03/08/2022 1039    CBC    Component Value  Date/Time   WBC 5.4 03/08/2022 1039   RBC 5.21 (H) 03/08/2022 1039   HGB 8.9 (L) 03/08/2022 1039   HCT 33.3 (L) 03/08/2022 1039   PLT 197 03/08/2022 1039   MCV 63.9 (L) 03/08/2022 1039   MCH 17.1 (L) 03/08/2022 1039   MCHC 26.7 (L) 03/08/2022 1039   RDW 17.5 (H) 03/08/2022 1039   LYMPHSABS 2.6 12/08/2021 1928   MONOABS 0.6 12/08/2021 1928   EOSABS 0.1 12/08/2021 1928   BASOSABS 0.1 12/08/2021 1928    Hgb A1C Lab Results  Component Value Date   HGBA1C 5.4 03/08/2022       Assessment and Plan:  RTC in 5 months for annual exam  Follow Up Instructions:    I discussed the assessment and treatment plan with the patient. The patient was provided an opportunity to ask questions and all were answered. The patient agreed with the plan and demonstrated an understanding of the instructions.   The patient was advised to call back or seek an in-person evaluation if the symptoms worsen or if the condition fails to improve as anticipated.   Webb Silversmith, NP

## 2022-04-12 NOTE — ED Triage Notes (Signed)
Pt presents via POV with complaints of CP that started today. Endorses a dull pain in center of her chest with associated dizziness. Of note, the patient had a cold ~1 week ago and felt like her sx resolved as of yesterday. Denies fevers, chills, SOB.   VS-99% 60 HR 108/55; 97/35 CBG-158  18G LAC

## 2022-04-12 NOTE — ED Provider Triage Note (Signed)
Emergency Medicine Provider Triage Evaluation Note  Terri Conner , a 52 y.o. female  was evaluated in triage.  Pt complains of chest pain that started today. She is also dizzy and weak.  Physical Exam  BP 125/80 (BP Location: Right Arm)   Pulse (!) 57   Temp 98.1 F (36.7 C) (Oral)   Resp 18   SpO2 98%  Gen:   Awake, no distress   Resp:  Normal effort  MSK:   Moves extremities without difficulty  Other:    Medical Decision Making  Medically screening exam initiated at 8:52 PM.  Appropriate orders placed.  Terri Conner was informed that the remainder of the evaluation will be completed by another provider, this initial triage assessment does not replace that evaluation, and the importance of remaining in the ED until their evaluation is complete.    Victorino Dike, FNP 04/12/22 2054

## 2022-04-12 NOTE — Telephone Encounter (Signed)
Apt 04/12/2022 at 4pm  Thanks,   -Mickel Baas

## 2022-04-13 ENCOUNTER — Encounter: Payer: Self-pay | Admitting: Internal Medicine

## 2022-04-13 ENCOUNTER — Telehealth (INDEPENDENT_AMBULATORY_CARE_PROVIDER_SITE_OTHER): Payer: Medicaid Other | Admitting: Internal Medicine

## 2022-04-13 DIAGNOSIS — F419 Anxiety disorder, unspecified: Secondary | ICD-10-CM | POA: Diagnosis not present

## 2022-04-13 DIAGNOSIS — F410 Panic disorder [episodic paroxysmal anxiety] without agoraphobia: Secondary | ICD-10-CM | POA: Diagnosis not present

## 2022-04-13 DIAGNOSIS — F32A Depression, unspecified: Secondary | ICD-10-CM

## 2022-04-13 DIAGNOSIS — F5101 Primary insomnia: Secondary | ICD-10-CM

## 2022-04-13 NOTE — Patient Instructions (Signed)
Panic Attack A panic attack is when you suddenly feel very afraid, uncomfortable, or nervous (anxious). A panic attack can happen when you are scared, or it may happen for no reason. A panic attack can feel like a heart attack or stroke. See your doctor when you have a panic attack to make sure you are not having a heart attack or stroke. What are the causes? Experiencing things that threaten your life, such as a war. Feeling worried or nervous for a long time (anxiety disorder). Being sad (depressed). Panic disorder. Certain medical conditions. Other causes may include: Certain medicines. Taking certain supplements. Illegal drugs. What increases the risk? Having another mental health condition. Using alcohol or drugs. Being under a lot of stress. Having events in your life that cause worry and sadness. What are the signs or symptoms? A panic attack: Starts suddenly. May last 5-10 minutes. Symptoms include one or more of these: A pounding heart. A feeling that your heart is beating in an unusual way or faster than normal (palpitations). Sweating or shaking. Feeling short of breath. Chest pain. Feeling like you may vomit (nauseous). Feeling dizzy or like you might faint. Other symptoms may include: Chills or hot flashes. Numbness or tingling in your lips, hands, or feet. Feeling confused. Fear of losing control. Fear of dying. How is this treated? A panic attack is a symptom of another condition. Treatment depends on the cause of the panic attack. If the cause is a medical problem, your doctor will treat that problem or refer you to a specialist. If the cause is emotional, you may be given medicines or referred to a counselor. If the cause is a medicine, your doctor may tell you to stop the medicine, change your dose, or take a different medicine. If the cause is an illegal drug, treatment may involve letting the drug wear off and taking medicine to help the drug leave your  body or to stop its effects. Attacks caused by heavy drug use may continue even if you stop using the drug. Most panic attacks go away after the cause is treated. Follow these instructions at home: Alcohol use Do not drink alcohol if: Your doctor tells you not to drink. You are pregnant, may be pregnant, or are planning to become pregnant. If you drink alcohol: Limit how much you have to: 0-1 drink a day for women. 0-2 drinks a day for men. Know how much alcohol is in your drink. In the [place] one drink equals one 12 oz bottle of beer (355 mL), one 5 oz glass of wine (148 mL), or one 1 oz glass of hard liquor (44 mL). General instructions  Take over-the-counter and prescription medicines only as told by your doctor. If you feel worried or nervous, try not to have caffeine. Take good care of your health. To do this: Eat healthy. Make sure to eat fresh fruits and vegetables, whole grains, lean meats, and low-fat dairy. Get enough sleep. Try to sleep for 7-8 hours each night. Exercise. Try to be active for 30 minutes 5 or more days a week. Do not smoke or use any products that contain nicotine or tobacco. If you need help quitting, ask your doctor. Keep all follow-up visits. Where to find more information Substance Abuse and Mental Health Services Administration (SAMHSA): samhsa.gov National Institute of Mental Health (NIMH): www.nimh.nih.gov Contact a doctor if: Your symptoms do not get better. Your symptoms get worse. You are not able to take your medicines as told. Get help   right away if: You have thoughts of hurting yourself or others. Get help right away if you feel like you may hurt yourself or others, or have thoughts about taking your own life. Go to your nearest emergency room or: Call 911. Call the National Suicide Prevention Lifeline at 1-800-273-8255 or 988. This is open 24 hours a day. Text the Crisis Text Line at 741741. Summary A panic attack is when you suddenly feel  very afraid, uncomfortable, or nervous (anxious). See your doctor when you have a panic attack to make sure that you do not have another serious problem. If you feel like you may hurt yourself or others, get help right away. Call 911. This information is not intended to replace advice given to you by your health care provider. Make sure you discuss any questions you have with your health care provider. Document Revised: 10/29/2020 Document Reviewed: 10/29/2020 Elsevier Patient Education  2023 Elsevier Inc.  

## 2022-04-13 NOTE — Progress Notes (Signed)
Virtual Visit via Video Note  I connected with Terri Conner on 04/13/22 at 11:20 AM EST by a video enabled telemedicine application and verified that I am speaking with the correct person using two identifiers.  Location: Patient: Home Provider: Office  Persons participating in this video call: Webb Silversmith, NP and Delorise Jackson  I discussed the limitations of evaluation and management by telemedicine and the availability of in person appointments. The patient expressed understanding and agreed to proceed.  History of Present Illness:  Patient reports she went to the ER last night, transported by EMS with complaint of "feeling weird", increased anxiety. She reports her heart was racing, she felt dizzy almost like she was going to pass out. She was shaking and jittery. ECG was normal.  Chest x-ray was normal.  Labs revealed a persistent anemia but otherwise unremarkable.  She left without being seen. She has a history of anxiety, depression and insomnia, managed on Hydroxyzine and Trazodone.   Past Medical History:  Diagnosis Date   Migraines     Current Outpatient Medications  Medication Sig Dispense Refill   hydrOXYzine (VISTARIL) 25 MG capsule Take 1 capsule (25 mg total) by mouth at bedtime as needed. 90 capsule 0   ibuprofen (ADVIL) 200 MG tablet Take 200 mg by mouth every 6 (six) hours as needed.     traZODone (DESYREL) 50 MG tablet Take 0.5-1 tablets (25-50 mg total) by mouth at bedtime as needed for sleep. 30 tablet 3   No current facility-administered medications for this visit.    No Known Allergies  Family History  Problem Relation Age of Onset   Anxiety disorder Mother    Depression Mother    Heart attack Father 13   Alcohol abuse Father    Alcohol abuse Sister    Lung cancer Maternal Grandmother     Social History   Socioeconomic History   Marital status: Married    Spouse name: Not on file   Number of children: Not on file   Years of education: Not on  file   Highest education level: Not on file  Occupational History   Not on file  Tobacco Use   Smoking status: Every Day    Packs/day: 1.00    Years: 18.00    Total pack years: 18.00    Types: Cigarettes   Smokeless tobacco: Never  Vaping Use   Vaping Use: Never used  Substance and Sexual Activity   Alcohol use: Yes    Comment: social   Drug use: Not Currently   Sexual activity: Not on file  Other Topics Concern   Not on file  Social History Narrative   Not on file   Social Determinants of Health   Financial Resource Strain: Not on file  Food Insecurity: Not on file  Transportation Needs: Not on file  Physical Activity: Not on file  Stress: Not on file  Social Connections: Not on file  Intimate Partner Violence: Not on file     Constitutional: Denies fever, malaise, fatigue, headache or abrupt weight changes.  Respiratory: Denies difficulty breathing, shortness of breath, cough or sputum production.   Cardiovascular: Pt reports palpitations. Denies chest pain, chest tightness, or swelling in the hands or feet.  Gastrointestinal: Denies abdominal pain, bloating, constipation, diarrhea or blood in the stool.  GU: Denies urgency, frequency, pain with urination, burning sensation, blood in urine, odor or discharge. Musculoskeletal: Denies decrease in range of motion, difficulty with gait, muscle pain or joint pain and  swelling.  Skin: Denies redness, rashes, lesions or ulcercations.  Neurological: Pt reports dizziness, insomnia. Denies difficulty with memory, difficulty with speech or problems with balance and coordination.  Psych: Pt has a history of anxiety and depression. Denies SI/HI.  No other specific complaints in a complete review of systems (except as listed in HPI above).    Observations/Objective:  Wt Readings from Last 3 Encounters:  04/12/22 253 lb (114.8 kg)  03/08/22 262 lb (118.8 kg)  12/08/21 250 lb (113.4 kg)    General: Appears her stated age,  obese, in NAD. Pulmonary/Chest: Normal effort. No respiratory distress.  Neurological: Alert and oriented.   Psychiatric: Mood and affect normal. Mildly anxious appearing. Judgment and thought content normal.     BMET    Component Value Date/Time   NA 136 04/12/2022 2056   K 4.0 04/12/2022 2056   CL 105 04/12/2022 2056   CO2 25 04/12/2022 2056   GLUCOSE 108 (H) 04/12/2022 2056   BUN 14 04/12/2022 2056   CREATININE 0.77 04/12/2022 2056   CREATININE 0.65 03/08/2022 1039   CALCIUM 8.8 (L) 04/12/2022 2056   GFRNONAA >60 04/12/2022 2056   GFRAA >60 10/14/2019 1233    Lipid Panel     Component Value Date/Time   CHOL 100 03/08/2022 1039   TRIG 88 03/08/2022 1039   HDL 38 (L) 03/08/2022 1039   CHOLHDL 2.6 03/08/2022 1039   LDLCALC 45 03/08/2022 1039    CBC    Component Value Date/Time   WBC 8.3 04/12/2022 2056   RBC 5.40 (H) 04/12/2022 2056   HGB 9.0 (L) 04/12/2022 2056   HCT 34.4 (L) 04/12/2022 2056   PLT 197 04/12/2022 2056   MCV 63.7 (L) 04/12/2022 2056   MCH 16.7 (L) 04/12/2022 2056   MCHC 26.2 (L) 04/12/2022 2056   RDW 19.8 (H) 04/12/2022 2056   LYMPHSABS 2.6 12/08/2021 1928   MONOABS 0.6 12/08/2021 1928   EOSABS 0.1 12/08/2021 1928   BASOSABS 0.1 12/08/2021 1928    Hgb A1C Lab Results  Component Value Date   HGBA1C 5.4 03/08/2022        Assessment and Plan:  Panic Attack, Anxiety and Depression, and Insomnia:  ER notes, labs and imaging reviewed She is feeling much better today Advised her to take Hydroxyzine as needed for increased anxiety Continue Trazodone as needed for sleep  RTC in 5 months for your annual exam  Follow Up Instructions:    I discussed the assessment and treatment plan with the patient. The patient was provided an opportunity to ask questions and all were answered. The patient agreed with the plan and demonstrated an understanding of the instructions.   The patient was advised to call back or seek an in-person evaluation  if the symptoms worsen or if the condition fails to improve as anticipated.    Webb Silversmith, NP

## 2022-04-21 ENCOUNTER — Telehealth: Payer: Self-pay | Admitting: Licensed Clinical Social Worker

## 2022-04-21 ENCOUNTER — Encounter: Payer: Medicaid Other | Admitting: Licensed Practical Nurse

## 2022-04-21 NOTE — Patient Outreach (Signed)
Transition Care Management Unsuccessful Follow-up Telephone Call  Date of discharge and from where:  04/12/22 from [facility] ED  Attempts:  1st Attempt  Reason for unsuccessful TCM follow-up call:  Left voice message

## 2022-05-08 ENCOUNTER — Encounter: Payer: Self-pay | Admitting: Internal Medicine

## 2022-05-31 ENCOUNTER — Encounter: Payer: Self-pay | Admitting: Internal Medicine

## 2022-05-31 ENCOUNTER — Telehealth (INDEPENDENT_AMBULATORY_CARE_PROVIDER_SITE_OTHER): Payer: Medicaid Other | Admitting: Internal Medicine

## 2022-05-31 DIAGNOSIS — G2581 Restless legs syndrome: Secondary | ICD-10-CM | POA: Diagnosis not present

## 2022-05-31 MED ORDER — ROPINIROLE HCL 0.25 MG PO TABS: 0.2500 mg | ORAL_TABLET | Freq: Two times a day (BID) | ORAL | 0 refills | Status: DC

## 2022-05-31 NOTE — Progress Notes (Signed)
Virtual Visit via Video Note  I connected with Terri Conner on 05/31/22 at  4:00 PM EST by a video enabled telemedicine application and verified that I am speaking with the correct person using two identifiers.  Location: Patient: Home Provider: Office  Person's participating in this video call: Webb Silversmith, NP and Payton Spark   I discussed the limitations of evaluation and management by telemedicine and the availability of in person appointments. The patient expressed understanding and agreed to proceed.  History of Present Illness:  Pt reports restless legs. She reports this has been going on for years. She reports it bothers her during the day and at night. She has never taken any prescription medication for this. She is taking Hydroxyzine and Trazadone at bedtime, but she reports she is having difficulty sleeping due to the restless legs.    Past Medical History:  Diagnosis Date   Migraines     Current Outpatient Medications  Medication Sig Dispense Refill   hydrOXYzine (VISTARIL) 25 MG capsule Take 1 capsule (25 mg total) by mouth at bedtime as needed. 90 capsule 0   ibuprofen (ADVIL) 200 MG tablet Take 200 mg by mouth every 6 (six) hours as needed.     traZODone (DESYREL) 50 MG tablet Take 0.5-1 tablets (25-50 mg total) by mouth at bedtime as needed for sleep. 30 tablet 3   No current facility-administered medications for this visit.    No Known Allergies  Family History  Problem Relation Age of Onset   Anxiety disorder Mother    Depression Mother    Heart attack Father 5   Alcohol abuse Father    Alcohol abuse Sister    Lung cancer Maternal Grandmother     Social History   Socioeconomic History   Marital status: Married    Spouse name: Not on file   Number of children: Not on file   Years of education: Not on file   Highest education level: Not on file  Occupational History   Not on file  Tobacco Use   Smoking status: Every Day    Packs/day: 1.00     Years: 18.00    Total pack years: 18.00    Types: Cigarettes   Smokeless tobacco: Never  Vaping Use   Vaping Use: Never used  Substance and Sexual Activity   Alcohol use: Yes    Comment: social   Drug use: Not Currently   Sexual activity: Not on file  Other Topics Concern   Not on file  Social History Narrative   Not on file   Social Determinants of Health   Financial Resource Strain: Not on file  Food Insecurity: Not on file  Transportation Needs: Not on file  Physical Activity: Not on file  Stress: Not on file  Social Connections: Not on file  Intimate Partner Violence: Not on file     Constitutional: Denies fever, malaise, fatigue, headache or abrupt weight changes.  Respiratory: Denies difficulty breathing, shortness of breath, cough or sputum production.   Cardiovascular: Denies chest pain, chest tightness, palpitations or swelling in the hands or feet.  Musculoskeletal: Denies decrease in range of motion, difficulty with gait, muscle pain or joint pain and swelling.  Neurological: Pt reports restless legs and insomnia. Denies dizziness, difficulty with memory, difficulty with speech or problems with balance and coordination.  Psych: Pt has a history of anxiety and depression. Denies SI/HI.  No other specific complaints in a complete review of systems (except as listed in HPI  above).  Observations/Objective:   Wt Readings from Last 3 Encounters:  04/12/22 253 lb (114.8 kg)  03/08/22 262 lb (118.8 kg)  12/08/21 250 lb (113.4 kg)    General: Appears her stated age, obese, in NAD. Pulmonary/Chest: Normal effort. No respiratory distress.  Neurological: Alert and oriented.  Psychiatric: Mood and affect normal. Behavior is normal. Judgment and thought content normal.   BMET    Component Value Date/Time   NA 136 04/12/2022 2056   K 4.0 04/12/2022 2056   CL 105 04/12/2022 2056   CO2 25 04/12/2022 2056   GLUCOSE 108 (H) 04/12/2022 2056   BUN 14 04/12/2022 2056    CREATININE 0.77 04/12/2022 2056   CREATININE 0.65 03/08/2022 1039   CALCIUM 8.8 (L) 04/12/2022 2056   GFRNONAA >60 04/12/2022 2056   GFRAA >60 10/14/2019 1233    Lipid Panel     Component Value Date/Time   CHOL 100 03/08/2022 1039   TRIG 88 03/08/2022 1039   HDL 38 (L) 03/08/2022 1039   CHOLHDL 2.6 03/08/2022 1039   LDLCALC 45 03/08/2022 1039    CBC    Component Value Date/Time   WBC 8.3 04/12/2022 2056   RBC 5.40 (H) 04/12/2022 2056   HGB 9.0 (L) 04/12/2022 2056   HCT 34.4 (L) 04/12/2022 2056   PLT 197 04/12/2022 2056   MCV 63.7 (L) 04/12/2022 2056   MCH 16.7 (L) 04/12/2022 2056   MCHC 26.2 (L) 04/12/2022 2056   RDW 19.8 (H) 04/12/2022 2056   LYMPHSABS 2.6 12/08/2021 1928   MONOABS 0.6 12/08/2021 1928   EOSABS 0.1 12/08/2021 1928   BASOSABS 0.1 12/08/2021 1928    Hgb A1C Lab Results  Component Value Date   HGBA1C 5.4 03/08/2022        Assessment and Plan:  RTC in 3 months for your annual exam  Follow Up Instructions:    I discussed the assessment and treatment plan with the patient. The patient was provided an opportunity to ask questions and all were answered. The patient agreed with the plan and demonstrated an understanding of the instructions.   The patient was advised to call back or seek an in-person evaluation if the symptoms worsen or if the condition fails to improve as anticipated.     Webb Silversmith, NP

## 2022-05-31 NOTE — Patient Instructions (Signed)
Restless Legs Syndrome Restless legs syndrome is a condition that causes uncomfortable feelings or sensations in the legs, especially while sitting or lying down. The sensations usually cause an overwhelming urge to move the legs. The arms can also sometimes be affected. The condition can range from mild to severe. The symptoms often interfere with a person's ability to sleep. What are the causes? The cause of this condition is not known. What increases the risk? The following factors may make you more likely to develop this condition: Being older than 50. Pregnancy. Being a woman. In general, the condition is more common in women than in men. A family history of the condition. Having iron deficiency. Overuse of caffeine, nicotine, or alcohol. Certain medical conditions, such as kidney disease, Parkinson's disease, or nerve damage. Certain medicines, such as those for high blood pressure, nausea, colds, allergies, depression, and some heart conditions. What are the signs or symptoms? The main symptom of this condition is uncomfortable sensations in the legs, such as: Pulling. Tingling. Prickling. Throbbing. Crawling. Burning. Usually, the sensations: Affect both sides of the body. Are worse when you sit or lie down. Are worse at night. These may make it difficult to fall asleep. Make you have a strong urge to move your legs. Are temporarily relieved by moving your legs or standing. The arms can also be affected, but this is rare. People who have this condition often have tiredness during the day because of their lack of sleep at night. How is this diagnosed? This condition may be diagnosed based on: Your symptoms. Blood tests. In some cases, you may be monitored in a sleep lab by a specialist (a sleep study). This can detect any disruptions in your sleep. How is this treated? This condition is treated by managing the symptoms. This may include: Lifestyle changes, such as  exercising, using relaxation techniques, and avoiding caffeine, alcohol, or tobacco. Iron supplements. Medicines. Parkinson's medications may be tried first. Anti-seizure medications can also be helpful. Follow these instructions at home: General instructions Take over-the-counter and prescription medicines only as told by your health care provider. Use methods to help relieve the uncomfortable sensations, such as: Massaging your legs. Walking or stretching. Taking a cold or hot bath. Keep all follow-up visits. This is important. Lifestyle     Practice good sleep habits. For example, go to bed and get up at the same time every day. Most adults should get 7-9 hours of sleep each night. Exercise regularly. Try to get at least 30 minutes of exercise most days of the week. Practice ways of relaxing, such as yoga or meditation. Avoid caffeine and alcohol. Do not use any products that contain nicotine or tobacco. These products include cigarettes, chewing tobacco, and vaping devices, such as e-cigarettes. If you need help quitting, ask your health care provider. Where to find more information National Institute of Neurological Disorders and Stroke: www.ninds.nih.gov Contact a health care provider if: Your symptoms get worse or they do not improve with treatment. Summary Restless legs syndrome is a condition that causes uncomfortable feelings or sensations in the legs, especially while sitting or lying down. The symptoms often interfere with your ability to sleep. This condition is treated by managing the symptoms. You may need to make lifestyle changes or take medicines. This information is not intended to replace advice given to you by your health care provider. Make sure you discuss any questions you have with your health care provider. Document Revised: 11/01/2020 Document Reviewed: 11/01/2020 Elsevier Patient Education    2023 Elsevier Inc.  

## 2022-06-16 ENCOUNTER — Encounter: Payer: Self-pay | Admitting: Internal Medicine

## 2022-06-22 ENCOUNTER — Other Ambulatory Visit: Payer: Self-pay | Admitting: Internal Medicine

## 2022-06-22 DIAGNOSIS — M25562 Pain in left knee: Secondary | ICD-10-CM | POA: Diagnosis not present

## 2022-06-22 DIAGNOSIS — M1711 Unilateral primary osteoarthritis, right knee: Secondary | ICD-10-CM | POA: Diagnosis not present

## 2022-06-22 DIAGNOSIS — M25561 Pain in right knee: Secondary | ICD-10-CM | POA: Diagnosis not present

## 2022-06-23 MED ORDER — HYDROXYZINE PAMOATE 25 MG PO CAPS: 25.0000 mg | ORAL_CAPSULE | Freq: Every evening | ORAL | 0 refills | Status: DC | PRN

## 2022-06-24 ENCOUNTER — Other Ambulatory Visit: Payer: Self-pay | Admitting: Internal Medicine

## 2022-06-24 NOTE — Telephone Encounter (Signed)
[facility] request [facility] [place].

## 2022-06-24 NOTE — Telephone Encounter (Signed)
Medication Refill - Medication: hydrOXYzine (VISTARIL) 25 MG capsule [id number]   Pt is calling to ask if the medication can be changed and  sent to the below pharmacy  Has the patient contacted their pharmacy? Yes.   (Agent: If no, request that the patient contact the pharmacy for the refill. If patient does not wish to contact the pharmacy document the reason why and proceed with request.) (Agent: If yes, when and what did the pharmacy advise?)  Preferred Pharmacy (with phone number or street name):  [id number] - [place], [place] Phone: [phone]  Fax: [phone]     Has the patient been seen for an appointment in the last year OR does the patient have an upcoming appointment? Yes.    Agent: Please be advised that RX refills may take up to 3 business days. We ask that you follow-up with your pharmacy.

## 2022-06-27 MED ORDER — HYDROXYZINE PAMOATE 25 MG PO CAPS: 25.0000 mg | ORAL_CAPSULE | Freq: Every evening | ORAL | 1 refills | Status: DC | PRN

## 2022-06-27 NOTE — Telephone Encounter (Signed)
Requested Prescriptions  Pending Prescriptions Disp Refills   hydrOXYzine (VISTARIL) 25 MG capsule 90 capsule 1    Sig: Take 1 capsule (25 mg total) by mouth at bedtime as needed.     Ear, Nose, and Throat:  Antihistamines 2 Passed - 06/24/2022  3:22 PM      Passed - Cr in normal range and within 360 days    Creat  Date Value Ref Range Status  03/08/2022 0.65 0.50 - 1.03 mg/dL Final   Creatinine, Ser  Date Value Ref Range Status  04/12/2022 0.77 0.44 - 1.00 mg/dL Final         Passed - Valid encounter within last 12 months    Recent Outpatient Visits           3 weeks ago Restless leg syndrome   [facility] [place], Coralie Keens, NP   2 months ago Panic attack   [facility] [place], Coralie Keens, NP   2 months ago Primary insomnia   [facility] [place], Coralie Keens, NP   3 months ago Abnormal TSH   [facility] [place], Coralie Keens, NP       Future Appointments             In 2 months [place], Coralie Keens, NP [facility], [facility]

## 2022-07-26 ENCOUNTER — Encounter: Payer: Self-pay | Admitting: Internal Medicine

## 2022-07-27 MED ORDER — ROPINIROLE HCL 0.25 MG PO TABS: 0.2500 mg | ORAL_TABLET | Freq: Two times a day (BID) | ORAL | 0 refills | Status: DC

## 2022-08-17 ENCOUNTER — Other Ambulatory Visit: Payer: Self-pay | Admitting: Internal Medicine

## 2022-08-18 NOTE — Telephone Encounter (Signed)
Requested Prescriptions  Refused Prescriptions Disp Refills   hydrOXYzine (VISTARIL) 25 MG capsule [Pharmacy Med Name: HYDROXYZINE PAM 25 MG CAP] 90 capsule 2    Sig: TAKE 1 CAPSULE (25 MG TOTAL) BY MOUTH AT BEDTIME AS NEEDED.     Ear, Nose, and Throat:  Antihistamines 2 Passed - 08/17/2022  1:31 PM      Passed - Cr in normal range and within 360 days    Creat  Date Value Ref Range Status  03/08/2022 0.65 0.50 - 1.03 mg/dL Final   Creatinine, Ser  Date Value Ref Range Status  04/12/2022 0.77 0.44 - 1.00 mg/dL Final         Passed - Valid encounter within last 12 months    Recent Outpatient Visits           2 months ago Restless leg syndrome   [facility] [place], Salvadore Oxford, NP   4 months ago Panic attack   [facility] [place], Salvadore Oxford, NP   4 months ago Primary insomnia   [place] [facility] [place], Salvadore Oxford, NP   5 months ago Abnormal TSH   [place] [facility] [place], Salvadore Oxford, NP       Future Appointments             In 2 weeks Sampson Si, Salvadore Oxford, NP [place], [facility]

## 2022-08-24 ENCOUNTER — Other Ambulatory Visit: Payer: Self-pay | Admitting: Internal Medicine

## 2022-08-25 MED ORDER — ROPINIROLE HCL 0.25 MG PO TABS: 0.2500 mg | ORAL_TABLET | Freq: Two times a day (BID) | ORAL | 0 refills | Status: DC

## 2022-08-26 ENCOUNTER — Other Ambulatory Visit: Payer: Self-pay | Admitting: Internal Medicine

## 2022-08-26 NOTE — Telephone Encounter (Signed)
Requested Prescriptions  Pending Prescriptions Disp Refills   traZODone (DESYREL) 50 MG tablet [Pharmacy Med Name: TRAZODONE 50 MG TABLET] 30 tablet 1    Sig: TAKE 1/2 TO 1 TABLET BY MOUTH AT BEDTIME AS NEEDED FOR SLEEP     Psychiatry: Antidepressants - Serotonin Modulator Passed - 08/26/2022 10:07 AM      Passed - Completed PHQ-2 or PHQ-9 in the last 360 days      Passed - Valid encounter within last 6 months    Recent Outpatient Visits           2 months ago Restless leg syndrome   [facility] [facility] [place], Salvadore Oxford, NP   4 months ago Panic attack   [facility] [place], Salvadore Oxford, NP   4 months ago Primary insomnia   [facility] [place], Salvadore Oxford, NP   5 months ago Abnormal TSH   [facility] [place], Salvadore Oxford, NP       Future Appointments             In 1 week [place], Salvadore Oxford, NP [facility], [facility]

## 2022-09-05 ENCOUNTER — Telehealth: Payer: Self-pay

## 2022-09-05 NOTE — Telephone Encounter (Signed)
..   Medicaid Managed Care   Unsuccessful Outreach Note  09/05/2022 Name: Terri Conner MRN: [id number] DOB: March 17, 1971  Referred by: Lorre Munroe, NP Reason for referral : Appointment   An unsuccessful telephone outreach was attempted today. The patient was referred to the case management team for assistance with care management and care coordination.   Follow Up Plan: A HIPAA compliant phone message was left for the patient providing contact information and requesting a return call.  The care management team will reach out to the patient again over the next 7-14 days.   Weston Settle Care Guide  [facility] Managed  Care Guide [facility]  [phone]

## 2022-09-07 ENCOUNTER — Encounter: Payer: Medicaid Other | Admitting: Internal Medicine

## 2022-09-20 ENCOUNTER — Other Ambulatory Visit: Payer: Self-pay | Admitting: Internal Medicine

## 2022-09-20 NOTE — Telephone Encounter (Signed)
Requested medication (s) are due for refill today:no  Requested medication (s) are on the active medication list: yes  Last refill:  last refill 08/26/22 #30 1 RF  Future visit scheduled:no  Notes to clinic:  called pt to make appt with PCP for CPE- pt advised she has lost her insurance Advised her of self pay process, pt did not want to make appt at this time. Pharmacy requesting 90 day refill   Requested Prescriptions  Pending Prescriptions Disp Refills   traZODone (DESYREL) 50 MG tablet [Pharmacy Med Name: TRAZODONE 50 MG TABLET] 90 tablet 1    Sig: TAKE 1/2 TO 1 TABLET BY MOUTH AT BEDTIME AS NEEDED FOR SLEEP     Psychiatry: Antidepressants - Serotonin Modulator Passed - 09/20/2022 10:31 AM      Passed - Completed PHQ-2 or PHQ-9 in the last 360 days      Passed - Valid encounter within last 6 months    Recent Outpatient Visits           3 months ago Restless leg syndrome   [facility] [place], Salvadore Oxford, NP   5 months ago Panic attack   [facility] [place], Salvadore Oxford, NP   5 months ago Primary insomnia   [facility] [place], Salvadore Oxford, NP   6 months ago Abnormal TSH   [place] [facility], Salvadore Oxford, NP               hydrOXYzine (VISTARIL) 25 MG capsule [Pharmacy Med Name: HYDROXYZINE PAM 25 MG CAP] 90 capsule 2    Sig: Take 1 capsule (25 mg total) by mouth at bedtime as needed.     Ear, Nose, and Throat:  Antihistamines 2 Passed - 09/20/2022 10:31 AM      Passed - Cr in normal range and within 360 days    Creat  Date Value Ref Range Status  03/08/2022 0.65 0.50 - 1.03 mg/dL Final   Creatinine, Ser  Date Value Ref Range Status  04/12/2022 0.77 0.44 - 1.00 mg/dL Final         Passed - Valid encounter within last 12 months    Recent Outpatient Visits           3 months ago Restless leg syndrome   [facility] [place], Salvadore Oxford, NP   5  months ago Panic attack   [facility] [place], Salvadore Oxford, NP   5 months ago Primary insomnia   [facility] [place], Salvadore Oxford, NP   6 months ago Abnormal TSH   [facility] [place], Salvadore Oxford, [place]

## 2022-09-29 ENCOUNTER — Ambulatory Visit (INDEPENDENT_AMBULATORY_CARE_PROVIDER_SITE_OTHER): Payer: Medicaid Other | Admitting: Internal Medicine

## 2022-09-29 ENCOUNTER — Encounter: Payer: Self-pay | Admitting: Internal Medicine

## 2022-09-29 VITALS — BP 118/64 | HR 70 | Temp 96.5°F | Ht 62.5 in | Wt 262.0 lb

## 2022-09-29 DIAGNOSIS — Z1211 Encounter for screening for malignant neoplasm of colon: Secondary | ICD-10-CM | POA: Diagnosis not present

## 2022-09-29 DIAGNOSIS — Z0001 Encounter for general adult medical examination with abnormal findings: Secondary | ICD-10-CM

## 2022-09-29 DIAGNOSIS — R7309 Other abnormal glucose: Secondary | ICD-10-CM

## 2022-09-29 DIAGNOSIS — Z1231 Encounter for screening mammogram for malignant neoplasm of breast: Secondary | ICD-10-CM

## 2022-09-29 DIAGNOSIS — Z6841 Body Mass Index (BMI) 40.0 and over, adult: Secondary | ICD-10-CM

## 2022-09-29 DIAGNOSIS — Z9884 Bariatric surgery status: Secondary | ICD-10-CM | POA: Diagnosis not present

## 2022-09-29 DIAGNOSIS — Z124 Encounter for screening for malignant neoplasm of cervix: Secondary | ICD-10-CM

## 2022-09-29 DIAGNOSIS — G2581 Restless legs syndrome: Secondary | ICD-10-CM

## 2022-09-29 LAB — CBC
MCH: 17.6 pg — ABNORMAL LOW (ref 27.0–33.0)
MCHC: 27.1 g/dL — ABNORMAL LOW (ref 32.0–36.0)

## 2022-09-29 NOTE — Assessment & Plan Note (Signed)
Encourage diet and exercise for weight loss 

## 2022-09-29 NOTE — Progress Notes (Signed)
Subjective:    Patient ID: Terri Conner, female    DOB: 02-03-1971, 51 y.o.   MRN: [id number]  HPI  Patient presents to clinic today for her annual exam.  Flu: 03/2022 Tetanus: 03/2022 COVID: x 2 Shingrix: Never Pap smear: > 5 years ago Mammogram: > 2 years ago Colon screening: never Vision screening: as needed Dentist: no teeth  Diet: She does eat meat. She consumes more fruits than veggies. She does eat some fried foods. She drinks mostly coffee, water, sports drinks. Exercise: None  Review of Systems  Past Medical History:  Diagnosis Date   Migraines     Current Outpatient Medications  Medication Sig Dispense Refill   hydrOXYzine (VISTARIL) 25 MG capsule Take 1 capsule (25 mg total) by mouth at bedtime as needed. 90 capsule 1   ibuprofen (ADVIL) 200 MG tablet Take 200 mg by mouth every 6 (six) hours as needed.     rOPINIRole (REQUIP) 0.25 MG tablet Take 1 tablet (0.25 mg total) by mouth 2 (two) times daily. 180 tablet 0   traZODone (DESYREL) 50 MG tablet TAKE 1/2 TO 1 TABLET BY MOUTH AT BEDTIME AS NEEDED FOR SLEEP 30 tablet 1   No current facility-administered medications for this visit.    No Known Allergies  Family History  Problem Relation Age of Onset   Anxiety disorder Mother    Depression Mother    Heart attack Father 10   Alcohol abuse Father    Alcohol abuse Sister    Lung cancer Maternal Grandmother     Social History   Socioeconomic History   Marital status: Married    Spouse name: Not on file   Number of children: Not on file   Years of education: Not on file   Highest education level: Not on file  Occupational History   Not on file  Tobacco Use   Smoking status: Every Day    Packs/day: 1.00    Years: 18.00    Additional pack years: 0.00    Total pack years: 18.00    Types: Cigarettes   Smokeless tobacco: Never  Vaping Use   Vaping Use: Never used  Substance and Sexual Activity   Alcohol use: Yes    Comment: social   Drug use:  Not Currently   Sexual activity: Not on file  Other Topics Concern   Not on file  Social History Narrative   Not on file   Social Determinants of Health   Financial Resource Strain: Not on file  Food Insecurity: Not on file  Transportation Needs: Not on file  Physical Activity: Not on file  Stress: Not on file  Social Connections: Not on file  Intimate Partner Violence: Not on file     Constitutional: Patient reports intermittent headaches.  Denies fever, malaise, fatigue, or abrupt weight changes.  HEENT: Denies eye pain, eye redness, ear pain, ringing in the ears, wax buildup, runny nose, nasal congestion, bloody nose, or sore throat. Respiratory: Denies difficulty breathing, shortness of breath, cough or sputum production.   Cardiovascular: Denies chest pain, chest tightness, palpitations or swelling in the hands or feet.  Gastrointestinal: Denies abdominal pain, bloating, constipation, diarrhea or blood in the stool.  GU: Denies urgency, frequency, pain with urination, burning sensation, blood in urine, odor or discharge. Musculoskeletal: Patient reports joint pain.  Denies decrease in range of motion, difficulty with gait, muscle pain or joint swelling.  Skin: Denies redness, rashes, lesions or ulcercations.  Neurological: Patient reports insomnia, restless  legs, hot flashes.  Denies dizziness, difficulty with memory, difficulty with speech or problems with balance and coordination.  Psych: Patient has a history of anxiety and depression.  Denies SI/HI.  No other specific complaints in a complete review of systems (except as listed in HPI above).     Objective:   Physical Exam BP 118/64 (BP Location: Left Arm, Patient Position: Sitting, Cuff Size: Large)   Pulse 70   Temp (!) 96.5 F (35.8 C) (Temporal)   Ht 5' 2.5" (1.588 m)   Wt 262 lb (118.8 kg)   LMP 01/14/2019 (Exact Date)   SpO2 98%   BMI 47.16 kg/m   Wt Readings from Last 3 Encounters:  04/12/22 253 lb  (114.8 kg)  03/08/22 262 lb (118.8 kg)  12/08/21 250 lb (113.4 kg)    General: Appears her stated age, obese, in NAD. Skin: Warm, dry and intact.  HEENT: Head: normal shape and size; Eyes: sclera white, no icterus, conjunctiva pink, PERRLA and EOMs intact;  Neck:  Neck supple, trachea midline. No masses, lumps or thyromegaly present.  Cardiovascular: Normal rate and rhythm. S1,S2 noted.  No murmur, rubs or gallops noted. No JVD or BLE edema. No carotid bruits noted. Pulmonary/Chest: Normal effort and positive vesicular breath sounds. No respiratory distress. No wheezes, rales or ronchi noted.  Abdomen: Normal bowel sounds.  Musculoskeletal: Strength 5/5 BUE/BLE. No difficulty with gait.  Neurological: Alert and oriented. Cranial nerves II-XII grossly intact. Coordination normal.  Psychiatric: Mood and affect normal. Behavior is normal. Judgment and thought content normal.     BMET    Component Value Date/Time   NA 136 04/12/2022 2056   K 4.0 04/12/2022 2056   CL 105 04/12/2022 2056   CO2 25 04/12/2022 2056   GLUCOSE 108 (H) 04/12/2022 2056   BUN 14 04/12/2022 2056   CREATININE 0.77 04/12/2022 2056   CREATININE 0.65 03/08/2022 1039   CALCIUM 8.8 (L) 04/12/2022 2056   GFRNONAA >60 04/12/2022 2056   GFRAA >60 10/14/2019 1233    Lipid Panel     Component Value Date/Time   CHOL 100 03/08/2022 1039   TRIG 88 03/08/2022 1039   HDL 38 (L) 03/08/2022 1039   CHOLHDL 2.6 03/08/2022 1039   LDLCALC 45 03/08/2022 1039    CBC    Component Value Date/Time   WBC 8.3 04/12/2022 2056   RBC 5.40 (H) 04/12/2022 2056   HGB 9.0 (L) 04/12/2022 2056   HCT 34.4 (L) 04/12/2022 2056   PLT 197 04/12/2022 2056   MCV 63.7 (L) 04/12/2022 2056   MCH 16.7 (L) 04/12/2022 2056   MCHC 26.2 (L) 04/12/2022 2056   RDW 19.8 (H) 04/12/2022 2056   LYMPHSABS 2.6 12/08/2021 1928   MONOABS 0.6 12/08/2021 1928   EOSABS 0.1 12/08/2021 1928   BASOSABS 0.1 12/08/2021 1928    Hgb A1C Lab Results   Component Value Date   HGBA1C 5.4 03/08/2022            Assessment & Plan:   Preventative health maintenance:  Encouraged her to get a flu shot in the fall Tetanus UTD Encouraged her to get her COVID-vaccine Discussed Shingrix vaccine, she will check coverage with her insurance company and schedule visit if she would like to have this done Referral to GYN for Pap smear Mammogram ordered-she will call to schedule She declines referral to GI for screening colonoscopy or cologuard at this time Encouraged her to consume a balanced diet and exercise regimen Advised her to see an  eye doctor and dentist annually We will check CBC, c-Met, lipid, A1c today  RTC in 6 months, follow-up chronic conditions Nicki Reaper, NP

## 2022-09-29 NOTE — Patient Instructions (Signed)

## 2022-09-30 LAB — LIPID PANEL
Cholesterol: 113 mg/dL (ref <200)
HDL: 45 mg/dL — ABNORMAL LOW (ref >=50)
Total CHOL/HDL Ratio: 2.5 (calc) (ref <5.0)
Triglycerides: 86 mg/dL (ref <150)

## 2022-09-30 LAB — COMPLETE METABOLIC PANEL WITH GFR
ALT: 20 U/L (ref 6–29)
AST: 19 U/L (ref 10–35)
BUN: 18 mg/dL (ref 7–25)
Calcium: 9.2 mg/dL (ref 8.6–10.4)
Potassium: 4.3 mmol/L (ref 3.5–5.3)
Sodium: 140 mmol/L (ref 135–146)
Total Protein: 6.4 g/dL (ref 6.1–8.1)

## 2022-09-30 LAB — HEMOGLOBIN A1C
Hgb A1c MFr Bld: 5.4 % of total Hgb (ref <5.7)
Mean Plasma Glucose: 108 mg/dL
eAG (mmol/L): 6 mmol/L

## 2022-09-30 LAB — VITAMIN B12: Vitamin B-12: >2000 pg/mL — ABNORMAL HIGH (ref 200–1100)

## 2022-09-30 LAB — CBC: MCV: 64.7 fL — ABNORMAL LOW (ref 80.0–100.0)

## 2022-10-03 LAB — COMPLETE METABOLIC PANEL WITH GFR
AG Ratio: 1.7 (calc) (ref 1.0–2.5)
Albumin: 4 g/dL (ref 3.6–5.1)
Alkaline phosphatase (APISO): 94 U/L (ref 37–153)
CO2: 26 mmol/L (ref 20–32)
Chloride: 108 mmol/L (ref 98–110)
Creat: 0.79 mg/dL (ref 0.50–1.03)
Globulin: 2.4 g/dL (calc) (ref 1.9–3.7)
Glucose, Bld: 91 mg/dL (ref 65–139)
Total Bilirubin: 0.3 mg/dL (ref 0.2–1.2)
eGFR: 90 mL/min/{1.73_m2} (ref >=60)

## 2022-10-03 LAB — FOLATE: Folate: >24 ng/mL

## 2022-10-03 LAB — CBC
HCT: 33.9 % — ABNORMAL LOW (ref 35.0–45.0)
Hemoglobin: 9.2 g/dL — ABNORMAL LOW (ref 11.7–15.5)
Platelets: 206 10*3/uL (ref 140–400)
RBC: 5.24 10*6/uL — ABNORMAL HIGH (ref 3.80–5.10)
RDW: 17.3 % — ABNORMAL HIGH (ref 11.0–15.0)
WBC: 7.6 10*3/uL (ref 3.8–10.8)

## 2022-10-03 LAB — LIPID PANEL
LDL Cholesterol (Calc): 51 mg/dL (calc)
Non-HDL Cholesterol (Calc): 68 mg/dL (calc) (ref <130)

## 2022-10-03 LAB — VITAMIN D 25 HYDROXY (VIT D DEFICIENCY, FRACTURES): Vit D, 25-Hydroxy: 29 ng/mL — ABNORMAL LOW (ref 30–100)

## 2022-10-03 LAB — VITAMIN B1: Vitamin B1 (Thiamine): 85 nmol/L — ABNORMAL HIGH (ref 8–30)

## 2022-10-10 ENCOUNTER — Telehealth: Payer: Self-pay

## 2022-10-10 NOTE — Telephone Encounter (Signed)
Chart review completed for patient. Patient is due for screening mammogram. Mychart message sent to patient to inquire about scheduling mammogram.  Kalob Bergen, Population Health Specialist.  

## 2022-10-18 ENCOUNTER — Other Ambulatory Visit: Payer: Self-pay | Admitting: Internal Medicine

## 2022-10-19 ENCOUNTER — Other Ambulatory Visit: Payer: Self-pay | Admitting: Internal Medicine

## 2022-10-19 NOTE — Telephone Encounter (Signed)
Requested medication (s) are due for refill today: Yes  Requested medication (s) are on the active medication list: Yes  Last refill:  06/27/22  Future visit scheduled: Yes  Notes to clinic:  See pharmacy request.    Requested Prescriptions  Pending Prescriptions Disp Refills   hydrOXYzine (VISTARIL) 25 MG capsule [Pharmacy Med Name: HYDROXYZINE PAM 25 MG CAP] 90 capsule 2    Sig: Take 1 capsule (25 mg total) by mouth at bedtime as needed.     Ear, Nose, and Throat:  Antihistamines 2 Passed - 10/18/2022 11:30 AM      Passed - Cr in normal range and within 360 days    Creat  Date Value Ref Range Status  09/29/2022 0.79 0.50 - 1.03 mg/dL Final         Passed - Valid encounter within last 12 months    Recent Outpatient Visits           2 weeks ago Screening for cervical cancer   [place] [facility] [place], Salvadore Oxford, NP   4 months ago Restless leg syndrome   [place] [facility] [place], Salvadore Oxford, NP   6 months ago Panic attack   [facility] [place], Salvadore Oxford, NP   6 months ago Primary insomnia   [facility] [place], Salvadore Oxford, NP   7 months ago Abnormal TSH   [facility] [place], Salvadore Oxford, NP       Future Appointments             In 5 months [place], Salvadore Oxford, NP [place] [facility], [facility]

## 2022-10-20 NOTE — Telephone Encounter (Signed)
Requested Prescriptions  Pending Prescriptions Disp Refills   traZODone (DESYREL) 50 MG tablet [Pharmacy Med Name: TRAZODONE 50 MG TABLET] 30 tablet 1    Sig: TAKE 1/2 TO 1 TABLET BY MOUTH AT BEDTIME AS NEEDED FOR SLEEP     Psychiatry: Antidepressants - Serotonin Modulator Passed - 10/19/2022  7:13 PM      Passed - Completed PHQ-2 or PHQ-9 in the last 360 days      Passed - Valid encounter within last 6 months    Recent Outpatient Visits           3 weeks ago Screening for cervical cancer   [facility] [place], Salvadore Oxford, NP   4 months ago Restless leg syndrome   [place] [place], Salvadore Oxford, NP   6 months ago Panic attack   [facility] [place], Salvadore Oxford, NP   6 months ago Primary insomnia   [facility] [place], Salvadore Oxford, NP   7 months ago Abnormal TSH   [place] [facility] [place], Salvadore Oxford, NP       Future Appointments             In 5 months Baity, Salvadore Oxford, NP  [facility] [place], [facility]

## 2022-10-24 ENCOUNTER — Encounter: Payer: Self-pay | Admitting: Internal Medicine

## 2022-10-24 ENCOUNTER — Telehealth: Payer: Medicaid Other | Admitting: Internal Medicine

## 2022-10-24 ENCOUNTER — Ambulatory Visit (INDEPENDENT_AMBULATORY_CARE_PROVIDER_SITE_OTHER): Payer: Medicaid Other | Admitting: Internal Medicine

## 2022-10-24 VITALS — BP 107/58 | HR 74 | Temp 97.4°F | Resp 18 | Wt 257.2 lb

## 2022-10-24 DIAGNOSIS — K219 Gastro-esophageal reflux disease without esophagitis: Secondary | ICD-10-CM | POA: Diagnosis not present

## 2022-10-24 MED ORDER — OMEPRAZOLE 20 MG PO CPDR: 20.0000 mg | DELAYED_RELEASE_CAPSULE | Freq: Every day | ORAL | 1 refills | Status: DC

## 2022-10-24 NOTE — Patient Instructions (Signed)

## 2022-10-24 NOTE — Assessment & Plan Note (Signed)
Will trial omeprazole 20 mg daily Encourage weight loss as this can help reduce reflux symptoms

## 2022-10-24 NOTE — Progress Notes (Signed)
Subjective:    Patient ID: Terri Conner, female    DOB: Mar 20, 1971, 52 y.o.   MRN: [id number]  HPI  Patient presents to clinic today with complaint of decreased appetite, difficulty swallowing, burning sensation in her chest, mild shortness of breath, cough and nausea. She noticed this 4 days ago. She reports her symptoms are constant. It is not worse after eating.  The cough is chronic and she does not know if this is worse in the last 4 days.  She denies chest pain, vomiting, constipation, diarrhea or blood in her stool. She denies recent change in diet or medications. She has tried pepto bismol with minimal relief of symptoms. She has had covid exposure but has had 2 home negative covid test.  Review of Systems     Past Medical History:  Diagnosis Date   Migraines     Current Outpatient Medications  Medication Sig Dispense Refill   hydrOXYzine (VISTARIL) 25 MG capsule TAKE 1 CAPSULE (25 MG TOTAL) BY MOUTH AT BEDTIME AS NEEDED. 90 capsule 1   rOPINIRole (REQUIP) 0.25 MG tablet Take 1 tablet (0.25 mg total) by mouth 2 (two) times daily. 180 tablet 0   traZODone (DESYREL) 50 MG tablet TAKE 1/2 TO 1 TABLET BY MOUTH AT BEDTIME AS NEEDED FOR SLEEP 30 tablet 1   No current facility-administered medications for this visit.    No Known Allergies  Family History  Problem Relation Age of Onset   Anxiety disorder Mother    Depression Mother    Heart attack Father 30   Alcohol abuse Father    Alcohol abuse Sister    Lung cancer Maternal Grandmother     Social History   Socioeconomic History   Marital status: Married    Spouse name: Not on file   Number of children: Not on file   Years of education: Not on file   Highest education level: Not on file  Occupational History   Not on file  Tobacco Use   Smoking status: Every Day    Current packs/day: 1.00    Average packs/day: 1 pack/day for 18.0 years (18.0 ttl pk-yrs)    Types: Cigarettes   Smokeless tobacco: Never  Vaping  Use   Vaping status: Never Used  Substance and Sexual Activity   Alcohol use: Yes    Comment: social   Drug use: Not Currently   Sexual activity: Not on file  Other Topics Concern   Not on file  Social History Narrative   Not on file   Social Determinants of Health   Financial Resource Strain: Not on file  Food Insecurity: Not on file  Transportation Needs: Not on file  Physical Activity: Not on file  Stress: Not on file  Social Connections: Not on file  Intimate Partner Violence: Not on file     Constitutional: Patient reports headaches.  Denies fever, malaise, fatigue, or abrupt weight changes.  HEENT: Denies eye pain, eye redness, ear pain, ringing in the ears, wax buildup, runny nose, nasal congestion, bloody nose, or sore throat. Respiratory: Patient reports cough and shortness of breath.  Denies difficulty breathing, or sputum production.   Cardiovascular: Denies chest pain, chest tightness, palpitations or swelling in the hands or feet.  Gastrointestinal: Patient reports difficulty swallowing, nausea.  Denies abdominal pain, bloating, constipation, diarrhea or blood in the stool.  GU: Denies urgency, frequency, pain with urination, burning sensation, blood in urine, odor or discharge. Musculoskeletal: Patient reports chronic back and joint pain.  Denies decrease in range of motion, difficulty with gait, or joint swelling.  Skin: Denies redness, rashes, lesions or ulcercations.  Neurological: Patient reports insomnia and restless legs.  Denies dizziness, difficulty with memory, difficulty with speech or problems with balance and coordination.  Psych: Patient has history of anxiety and depression.  Denies SI/HI.  No other specific complaints in a complete review of systems (except as listed in HPI above).  Objective:   Physical Exam BP (!) 107/58   Pulse 74   Temp (!) 97.4 F (36.3 C)   Resp 18   Wt 257 lb 3.2 oz (116.7 kg)   LMP 01/14/2019 (Exact Date)   SpO2 98%    BMI 46.29 kg/m   Wt Readings from Last 3 Encounters:  09/29/22 262 lb (118.8 kg)  04/12/22 253 lb (114.8 kg)  03/08/22 262 lb (118.8 kg)    General: Appears her stated age, obese in NAD. Skin: Warm, dry and intact. No rashes, lesions or ulcerations noted. HEENT: Head: normal shape and size; Eyes: sclera white, no icterus, conjunctiva pink, PERRLA and EOMs intact;  Cardiovascular: Normal rate and rhythm. S1,S2 noted.  No murmur, rubs or gallops noted.  Pulmonary/Chest: Normal effort and positive vesicular breath sounds with intermittent expiratory wheezing noted. No respiratory distress. No rales or ronchi noted.  Abdomen: Soft and nontender. Normal bowel sounds. No distention or masses noted.  Musculoskeletal:  No difficulty with gait.  Neurological: Alert and oriented. Coordination normal.    BMET    Component Value Date/Time   NA 140 09/29/2022 1443   K 4.3 09/29/2022 1443   CL 108 09/29/2022 1443   CO2 26 09/29/2022 1443   GLUCOSE 91 09/29/2022 1443   BUN 18 09/29/2022 1443   CREATININE 0.79 09/29/2022 1443   CALCIUM 9.2 09/29/2022 1443   GFRNONAA >60 04/12/2022 2056   GFRAA >60 10/14/2019 1233    Lipid Panel     Component Value Date/Time   CHOL 113 09/29/2022 1443   TRIG 86 09/29/2022 1443   HDL 45 (L) 09/29/2022 1443   CHOLHDL 2.5 09/29/2022 1443   LDLCALC 51 09/29/2022 1443    CBC    Component Value Date/Time   WBC 7.6 09/29/2022 1443   RBC 5.24 (H) 09/29/2022 1443   HGB 9.2 (L) 09/29/2022 1443   HCT 33.9 (L) 09/29/2022 1443   PLT 206 09/29/2022 1443   MCV 64.7 (L) 09/29/2022 1443   MCH 17.6 (L) 09/29/2022 1443   MCHC 27.1 (L) 09/29/2022 1443   RDW 17.3 (H) 09/29/2022 1443   LYMPHSABS 2.6 12/08/2021 1928   MONOABS 0.6 12/08/2021 1928   EOSABS 0.1 12/08/2021 1928   BASOSABS 0.1 12/08/2021 1928    Hgb A1C Lab Results  Component Value Date   HGBA1C 5.4 09/29/2022            Assessment & Plan:      RTC in 5 months for your annual  exam Nicki Reaper, NP

## 2022-10-28 DIAGNOSIS — M25562 Pain in left knee: Secondary | ICD-10-CM | POA: Diagnosis not present

## 2022-10-28 DIAGNOSIS — M1712 Unilateral primary osteoarthritis, left knee: Secondary | ICD-10-CM | POA: Diagnosis not present

## 2022-11-11 DIAGNOSIS — T162XXA Foreign body in left ear, initial encounter: Secondary | ICD-10-CM | POA: Diagnosis not present

## 2022-11-14 ENCOUNTER — Encounter: Payer: Self-pay | Admitting: Internal Medicine

## 2022-11-21 ENCOUNTER — Telehealth (INDEPENDENT_AMBULATORY_CARE_PROVIDER_SITE_OTHER): Payer: Medicaid Other | Admitting: Internal Medicine

## 2022-11-21 DIAGNOSIS — F5101 Primary insomnia: Secondary | ICD-10-CM

## 2022-11-21 DIAGNOSIS — H938X2 Other specified disorders of left ear: Secondary | ICD-10-CM

## 2022-11-21 MED ORDER — TRAZODONE HCL 50 MG PO TABS: 150.0000 mg | ORAL_TABLET | Freq: Every evening | ORAL | 0 refills | Status: DC | PRN

## 2022-11-21 NOTE — Assessment & Plan Note (Signed)
Will increase trazodone to 150 mg at bedtime

## 2022-11-21 NOTE — Patient Instructions (Signed)
Eustachian Tube Dysfunction  Eustachian tube dysfunction refers to a condition in which a blockage develops in the narrow passage that connects the middle ear to the back of the nose (eustachian tube). The eustachian tube regulates air pressure in the middle ear by letting air move between the ear and nose. It also helps to drain fluid from the middle ear space. Eustachian tube dysfunction can affect one or both ears. When the eustachian tube does not function properly, air pressure, fluid, or both can build up in the middle ear. What are the causes? This condition occurs when the eustachian tube becomes blocked or cannot open normally. Common causes of this condition include: Ear infections. Colds and other infections that affect the nose, mouth, and throat (upper respiratory tract). Allergies. Irritation from cigarette smoke. Irritation from stomach acid coming up into the esophagus (gastroesophageal reflux). The esophagus is the part of the body that moves food from the mouth to the stomach. Sudden changes in air pressure, such as from descending in an airplane or scuba diving. Abnormal growths in the nose or throat, such as: Growths that line the nose (nasal polyps). Abnormal growth of cells (tumors). Enlarged tissue at the back of the throat (adenoids). What increases the risk? You are more likely to develop this condition if: You smoke. You are overweight. You are a child who has: Certain birth defects of the mouth, such as cleft palate. Large tonsils or adenoids. What are the signs or symptoms? Common symptoms of this condition include: A feeling of fullness in the ear. Ear pain. Clicking or popping noises in the ear. Ringing in the ear (tinnitus). Hearing loss. Loss of balance. Dizziness. Symptoms may get worse when the air pressure around you changes, such as when you travel to an area of high elevation, fly on an airplane, or go scuba diving. How is this diagnosed? This  condition may be diagnosed based on: Your symptoms. A physical exam of your ears, nose, and throat. Tests, such as those that measure: The movement of your eardrum. Your hearing (audiometry). How is this treated? Treatment depends on the cause and severity of your condition. In mild cases, you may relieve your symptoms by moving air into your ears. This is called "popping the ears." In more severe cases, or if you have symptoms of fluid in your ears, treatment may include: Medicines to relieve congestion (decongestants). Medicines that treat allergies (antihistamines). Nasal sprays or ear drops that contain medicines that reduce swelling (steroids). A procedure to drain the fluid in your eardrum. In this procedure, a small tube may be placed in the eardrum to: Drain the fluid. Restore the air in the middle ear space. A procedure to insert a balloon device through the nose to inflate the opening of the eustachian tube (balloon dilation). Follow these instructions at home: Lifestyle Do not do any of the following until your health care provider approves: Travel to high altitudes. Fly in airplanes. Work in a pressurized cabin or room. Scuba dive. Do not use any products that contain nicotine or tobacco. These products include cigarettes, chewing tobacco, and vaping devices, such as e-cigarettes. If you need help quitting, ask your health care provider. Keep your ears dry. Wear fitted earplugs during showering and bathing. Dry your ears completely after. General instructions Take over-the-counter and prescription medicines only as told by your health care provider. Use techniques to help pop your ears as recommended by your health care provider. These may include: Chewing gum. Yawning. Frequent, forceful swallowing.   Closing your mouth, holding your nose closed, and gently blowing as if you are trying to blow air out of your nose. Keep all follow-up visits. This is important. Contact a  health care provider if: Your symptoms do not go away after treatment. Your symptoms come back after treatment. You are unable to pop your ears. You have: A fever. Pain in your ear. Pain in your head or neck. Fluid draining from your ear. Your hearing suddenly changes. You become very dizzy. You lose your balance. Get help right away if: You have a sudden, severe increase in any of your symptoms. Summary Eustachian tube dysfunction refers to a condition in which a blockage develops in the eustachian tube. It can be caused by ear infections, allergies, inhaled irritants, or abnormal growths in the nose or throat. Symptoms may include ear pain or fullness, hearing loss, or ringing in the ears. Mild cases are treated with techniques to unblock the ears, such as yawning or chewing gum. More severe cases are treated with medicines or procedures. This information is not intended to replace advice given to you by your health care provider. Make sure you discuss any questions you have with your health care provider. Document Revised: 06/01/2020 Document Reviewed: 06/01/2020 Elsevier Patient Education  2024 [employer]vier Inc[employer]  

## 2022-11-21 NOTE — Progress Notes (Signed)
Virtual Visit via Video Note  I connected with Terri Conner on 11/21/22 at 11:20 AM EDT by a video enabled telemedicine application and verified that I am speaking with the correct person using two identifiers.  Location: Patient: Home Provider: Office  Persons participating in this video call: Nicki Reaper, NP and [name]   I discussed the limitations of evaluation and management by telemedicine and the availability of in person appointments. The patient expressed understanding and agreed to proceed.  History of Present Illness:  Patient reports left ear discomfort. She reports she was seen at urgent care for the same. The reported that she had a hair in her ear. They tried to flush her ear out but they were unable to get the hair out. She reports a few days later, she was able to get the hair out. Since that time, she reports ear fullness, muffled hearing. She reports the ear seem like it is draining. She has tried an antihistamine OTC with minimal relief of symptoms.   She also wants to discuss insomnia.  She has difficulty falling and staying asleep.  She is taking trazodone as prescribed but does not feel like it is effective unless she takes 3 pills.  There is no sleep study on file.   Past Medical History:  Diagnosis Date   Migraines     Current Outpatient Medications  Medication Sig Dispense Refill   hydrOXYzine (VISTARIL) 25 MG capsule TAKE 1 CAPSULE (25 MG TOTAL) BY MOUTH AT BEDTIME AS NEEDED. 90 capsule 1   omeprazole (PRILOSEC) 20 MG capsule Take 1 capsule (20 mg total) by mouth daily. 90 capsule 1   rOPINIRole (REQUIP) 0.25 MG tablet Take 1 tablet (0.25 mg total) by mouth 2 (two) times daily. 180 tablet 0   traZODone (DESYREL) 50 MG tablet TAKE 1/2 TO 1 TABLET BY MOUTH AT BEDTIME AS NEEDED FOR SLEEP 30 tablet 1   No current facility-administered medications for this visit.    No Known Allergies  Family History  Problem Relation Age of Onset   Anxiety disorder  Mother    Depression Mother    Heart attack Father 55   Alcohol abuse Father    Alcohol abuse Sister    Lung cancer Maternal Grandmother     Social History   Socioeconomic History   Marital status: Married    Spouse name: Not on file   Number of children: Not on file   Years of education: Not on file   Highest education level: Not on file  Occupational History   Not on file  Tobacco Use   Smoking status: Every Day    Current packs/day: 1.00    Average packs/day: 1 pack/day for 18.0 years (18.0 ttl pk-yrs)    Types: Cigarettes   Smokeless tobacco: Never  Vaping Use   Vaping status: Never Used  Substance and Sexual Activity   Alcohol use: Yes    Comment: social   Drug use: Not Currently   Sexual activity: Not on file  Other Topics Concern   Not on file  Social History Narrative   Not on file   Social Determinants of Health   Financial Resource Strain: Not on file  Food Insecurity: Not on file  Transportation Needs: Not on file  Physical Activity: Not on file  Stress: Not on file  Social Connections: Not on file  Intimate Partner Violence: Not on file     Constitutional: Patient reports intermittent headaches.  Denies fever, malaise, fatigue, or  abrupt weight changes.  HEENT: Patient reports left ear discomfort.  Denies eye pain, eye redness, ear pain, ringing in the ears, wax buildup, runny nose, nasal congestion, bloody nose, or sore throat. Respiratory: Denies difficulty breathing, shortness of breath, cough or sputum production.   Cardiovascular: Denies chest pain, chest tightness, palpitations or swelling in the hands or feet.  Gastrointestinal: Denies abdominal pain, bloating, constipation, diarrhea or blood in the stool.  GU: Denies urgency, frequency, pain with urination, burning sensation, blood in urine, odor or discharge. Musculoskeletal: Patient reports joint pain.  Denies decrease in range of motion, difficulty with gait, muscle pain or joint swelling.   Skin: Denies redness, rashes, lesions or ulcercations.  Neurological: Patient reports restless legs, insomnia.  Denies dizziness, difficulty with memory, difficulty with speech or problems with balance and coordination.  Psych: Patient has a history of anxiety and depression.  Denies SI/HI.  No other specific complaints in a complete review of systems (except as listed in HPI above).    Observations/Objective:  Wt Readings from Last 3 Encounters:  10/24/22 257 lb 3.2 oz (116.7 kg)  09/29/22 262 lb (118.8 kg)  04/12/22 253 lb (114.8 kg)    General: Appears her stated age, well developed, well nourished in NAD. Pulmonary/Chest: Normal effort. No respiratory distress.  Neurological: Alert and oriented. Coordination normal.  Psychiatric: Mood and affect normal. Behavior is normal. Judgment and thought content normal.     BMET    Component Value Date/Time   NA 140 09/29/2022 1443   K 4.3 09/29/2022 1443   CL 108 09/29/2022 1443   CO2 26 09/29/2022 1443   GLUCOSE 91 09/29/2022 1443   BUN 18 09/29/2022 1443   CREATININE 0.79 09/29/2022 1443   CALCIUM 9.2 09/29/2022 1443   GFRNONAA >60 04/12/2022 2056   GFRAA >60 10/14/2019 1233    Lipid Panel     Component Value Date/Time   CHOL 113 09/29/2022 1443   TRIG 86 09/29/2022 1443   HDL 45 (L) 09/29/2022 1443   CHOLHDL 2.5 09/29/2022 1443   LDLCALC 51 09/29/2022 1443    CBC    Component Value Date/Time   WBC 7.6 09/29/2022 1443   RBC 5.24 (H) 09/29/2022 1443   HGB 9.2 (L) 09/29/2022 1443   HCT 33.9 (L) 09/29/2022 1443   PLT 206 09/29/2022 1443   MCV 64.7 (L) 09/29/2022 1443   MCH 17.6 (L) 09/29/2022 1443   MCHC 27.1 (L) 09/29/2022 1443   RDW 17.3 (H) 09/29/2022 1443   LYMPHSABS 2.6 12/08/2021 1928   MONOABS 0.6 12/08/2021 1928   EOSABS 0.1 12/08/2021 1928   BASOSABS 0.1 12/08/2021 1928    Hgb A1C Lab Results  Component Value Date   HGBA1C 5.4 09/29/2022        Assessment and Plan:  Left ear  fullness:  Continue antihistamine OTC Try Flonase 2 sprays left nostril twice daily x 5 days Update me for new or worsening symptoms  RTC in 4 months for follow-up of chronic conditions  Follow Up Instructions:    I discussed the assessment and treatment plan with the patient. The patient was provided an opportunity to ask questions and all were answered. The patient agreed with the plan and demonstrated an understanding of the instructions.   The patient was advised to call back or seek an in-person evaluation if the symptoms worsen or if the condition fails to improve as anticipated.    Nicki Reaper, NP

## 2022-12-14 ENCOUNTER — Ambulatory Visit: Payer: Medicaid Other | Admitting: Internal Medicine

## 2022-12-15 ENCOUNTER — Ambulatory Visit: Payer: Medicaid Other | Admitting: Internal Medicine

## 2023-01-09 ENCOUNTER — Ambulatory Visit: Payer: Medicaid Other | Admitting: Internal Medicine

## 2023-01-09 ENCOUNTER — Encounter: Payer: Self-pay | Admitting: Internal Medicine

## 2023-01-09 VITALS — BP 122/68 | HR 86 | Wt 253.0 lb

## 2023-01-09 DIAGNOSIS — G2581 Restless legs syndrome: Secondary | ICD-10-CM

## 2023-01-09 DIAGNOSIS — M79672 Pain in left foot: Secondary | ICD-10-CM

## 2023-01-09 DIAGNOSIS — G8929 Other chronic pain: Secondary | ICD-10-CM

## 2023-01-09 DIAGNOSIS — Z23 Encounter for immunization: Secondary | ICD-10-CM

## 2023-01-09 DIAGNOSIS — H9202 Otalgia, left ear: Secondary | ICD-10-CM

## 2023-01-09 MED ORDER — GABAPENTIN 300 MG PO CAPS: 300.0000 mg | ORAL_CAPSULE | Freq: Two times a day (BID) | ORAL | 1 refills | Status: DC

## 2023-01-09 MED ORDER — MELOXICAM 7.5 MG PO TABS: 7.5000 mg | ORAL_TABLET | Freq: Every day | ORAL | 1 refills | Status: DC

## 2023-01-09 NOTE — Patient Instructions (Signed)
Heel Spur  A heel spur is a bony growth that forms on the bottom of the heel bone (calcaneus). Heel spurs are common. They often cause inflammation in the plantar fascia, which is the band of tissue that connects the toe bones to the heel bone. When the plantar fascia is inflamed, it is called plantar fasciitis. This may cause pain on the bottom of the foot, near the heel. Many people with plantar fasciitis also have heel spurs. However, spurs are not the cause of plantar fasciitis pain. What are the causes? The exact cause of heel spurs is not known. They may be caused by: Pressure on the heel bone. Bands of tissues that connect muscle to bone (tendons) pulling on the heel bone. What increases the risk? You are more likely to develop this condition if you: Are older than age 53. Are overweight. Have wear-and-tear arthritis (osteoarthritis). Have plantar fascia inflammation. Participate in sports or activities that include a lot of running or jumping. Wear poorly fitted shoes. What are the signs or symptoms? Some people have no symptoms. If you do have symptoms, they may include: Pain in the bottom of your heel. Pain that is worse when you first get out of bed. Pain that gets worse after walking or standing. How is this diagnosed? This condition may be diagnosed based on: Your symptoms and medical history. A physical exam. A foot X-ray. How is this treated? Treatment for this condition depends on how much pain you have. Treatment options may include: Doing stretching exercises and losing weight, if necessary. Wearing specific shoes or inserts inside of shoes (orthotics) for comfort and support. Wearing splints on your feet while you sleep. Splints keep your feet in a position (usually a 90-degree angle) that should prevent and relieve the pain you feel when you first get out of bed. They also make stretching easier in the morning. Taking over-the-counter medicine to relieve pain, such  as NSAIDs. Using high-intensity sound waves to break up the heel spur (extracorporeal shock wave therapy). Getting steroid injections in your heel to reduce inflammation. Having surgery, if your heel spur causes long-term (chronic) pain. Follow these instructions at home:  Activity Avoid activities that cause pain until you recover, or for as long as told by your health care provider. Do stretching exercises as told. Stretch before exercising or being physically active. Managing pain, stiffness, and swelling If directed, put ice on your foot. To do this: Put ice in a plastic bag. Place a towel between your skin and the bag. Leave the ice on for 20 minutes, 2-3 times a day. Remove the ice if your skin turns bright red. This is very important. If you cannot feel pain, heat, or cold, you have a greater risk of damage to the area. Move your toes often to reduce stiffness and swelling. When possible, raise (elevate) your foot above the level of your heart while you are sitting or lying down. General instructions Take over-the-counter and prescription medicines only as told by your health care provider. Wear supportive shoes that fit well. Wear splints, inserts, or orthotics as told by your health care provider. If recommended, work with your health care provider to lose weight. This can relieve pressure on your foot. Do not use any products that contain nicotine or tobacco, such as cigarettes, e-cigarettes, and chewing tobacco. These can delay bone healing. If you need help quitting, ask your health care provider. Keep all follow-up visits. This is important. Where to find more information [employer]  [employer]: www.orthoinfo.aaos.org Contact a health care provider if: Your pain does not go away with treatment. Your pain gets worse. Summary A heel spur is a bony growth that forms on the bottom of the heel bone (calcaneus). Heel spurs often cause inflammation in the plantar  fascia, which is the band of tissue that connects the toes to the heel bone. This may cause pain on the bottom of the foot, near the heel. Doing stretching exercises, losing weight, wearing specific shoes or shoe inserts, wearing splints while you sleep, and taking pain medicine may ease the pain and stiffness. Other treatment options may include high-intensity sound waves to break up the heel spur, steroid injections, or surgery. This information is not intended to replace advice given to you by your health care provider. Make sure you discuss any questions you have with your health care provider. Document Revised: 07/16/2019 Document Reviewed: 07/16/2019 Elsevier Patient Education  2024 [employer].

## 2023-01-09 NOTE — Progress Notes (Signed)
Subjective:    Patient ID: Terri Conner, female    DOB: 1970/06/28, 52 y.o.   MRN: [id number]  HPI  Discussed the use of AI scribe software for clinical note transcription with the patient, who gave verbal consent to proceed.  History of Present Illness   The patient presents with three primary concerns: left ear discomfort, worsening restless legs syndrome, and left foot pain.  The patient reports intermittent left ear pain, described as a sensation of pressure. Despite a five-day course of Flonase and antihistamines, the symptoms have not improved. The patient also notes a slight muffling of hearing in the affected ear, but denies any noticeable drainage. They have a history of intermittent tinnitus, but do not associate it with the current ear discomfort.  The patient's restless legs syndrome, previously managed with Ropinirole, has worsened. The medication, taken twice daily, has become less effective over time, and the patient reports that the discomfort has begun to affect their arms as well. The symptoms are present both during the day and at night, and have become distressing enough to interfere with sleep and daily activities.  Lastly, the patient has been experiencing sharp, intermittent pain in their left foot for the past couple of months. The pain is localized to a specific area on the heel, where the patient has noticed a bump. The pain is not constant, but tends to occur after periods of rest and does not seem to be associated with the patient's known sciatica. The patient denies any numbness or tingling in the foot, and there is no history of injury to the area.       Review of Systems     Past Medical History:  Diagnosis Date   Migraines     Current Outpatient Medications  Medication Sig Dispense Refill   hydrOXYzine (VISTARIL) 25 MG capsule TAKE 1 CAPSULE (25 MG TOTAL) BY MOUTH AT BEDTIME AS NEEDED. 90 capsule 1   omeprazole (PRILOSEC) 20 MG capsule Take 1 capsule  (20 mg total) by mouth daily. 90 capsule 1   rOPINIRole (REQUIP) 0.25 MG tablet Take 1 tablet (0.25 mg total) by mouth 2 (two) times daily. 180 tablet 0   traZODone (DESYREL) 50 MG tablet Take 3 tablets (150 mg total) by mouth at bedtime as needed for sleep. 270 tablet 0   No current facility-administered medications for this visit.    No Known Allergies  Family History  Problem Relation Age of Onset   Anxiety disorder Mother    Depression Mother    Heart attack Father 79   Alcohol abuse Father    Alcohol abuse Sister    Lung cancer Maternal Grandmother     Social History   Socioeconomic History   Marital status: Married    Spouse name: Not on file   Number of children: Not on file   Years of education: Not on file   Highest education level: Not on file  Occupational History   Not on file  Tobacco Use   Smoking status: Every Day    Current packs/day: 1.00    Average packs/day: 1 pack/day for 18.0 years (18.0 ttl pk-yrs)    Types: Cigarettes   Smokeless tobacco: Never  Vaping Use   Vaping status: Never Used  Substance and Sexual Activity   Alcohol use: Yes    Comment: social   Drug use: Not Currently   Sexual activity: Not on file  Other Topics Concern   Not on file  Social History Narrative  Not on file   Social Determinants of Health   Financial Resource Strain: Not on file  Food Insecurity: Not on file  Transportation Needs: Not on file  Physical Activity: Not on file  Stress: Not on file  Social Connections: Not on file  Intimate Partner Violence: Not on file     Constitutional: Patient reports intermittent headaches.  Denies fever, malaise, fatigue, or abrupt weight changes.  HEENT: Patient reports left ear pain, ringing in the ears.  Denies eye pain, eye redness, wax buildup, runny nose, nasal congestion, bloody nose, or sore throat. Respiratory: Denies difficulty breathing, shortness of breath, cough or sputum production.   Cardiovascular: Denies  chest pain, chest tightness, palpitations or swelling in the hands or feet.  Gastrointestinal: Denies abdominal pain, bloating, constipation, diarrhea or blood in the stool.  GU: Denies urgency, frequency, pain with urination, burning sensation, blood in urine, odor or discharge. Musculoskeletal: Patient reports chronic back, left heel pain.  Denies decrease in range of motion, difficulty with gait, or joint swelling.  Skin: Denies redness, rashes, lesions or ulcercations.  Neurological: Patient reports insomnia, restless legs.  Denies dizziness, difficulty with memory, difficulty with speech or problems with balance and coordination.  Psych: Patient has a history of anxiety and depression.  Denies SI/HI.  No other specific complaints in a complete review of systems (except as listed in HPI above).  Objective:   Physical Exam   BP 122/68 (BP Location: Left Arm, Patient Position: Sitting, Cuff Size: Normal)   Pulse 86   Wt 253 lb (114.8 kg)   LMP 01/14/2019 (Exact Date)   SpO2 97%   BMI 45.54 kg/m   Wt Readings from Last 3 Encounters:  10/24/22 257 lb 3.2 oz (116.7 kg)  09/29/22 262 lb (118.8 kg)  04/12/22 253 lb (114.8 kg)    General: Appears her stated age, obese, in NAD. Skin: Warm, dry and intact.  HEENT: Head: normal shape and size; Eyes: sclera white, no icterus, conjunctiva pink, PERRLA and EOMs intact; Ears: Tm's gray and intact, normal light reflex;  Cardiovascular: Normal rate and rhythm. S1,S2 noted.  No murmur, rubs or gallops noted.  Pulmonary/Chest: Normal effort and positive vesicular breath sounds. No respiratory distress. No wheezes, rales or ronchi noted.  Musculoskeletal: Normal flexion, extension and rotation of the left ankle.  No pain with palpation over the Achilles.  0.5 cm nodule noted of the left posterior heel.  No difficulty with gait.  Neurological: Alert and oriented. Coordination normal.    BMET    Component Value Date/Time   NA 140 09/29/2022  1443   K 4.3 09/29/2022 1443   CL 108 09/29/2022 1443   CO2 26 09/29/2022 1443   GLUCOSE 91 09/29/2022 1443   BUN 18 09/29/2022 1443   CREATININE 0.79 09/29/2022 1443   CALCIUM 9.2 09/29/2022 1443   GFRNONAA >60 04/12/2022 2056   GFRAA >60 10/14/2019 1233    Lipid Panel     Component Value Date/Time   CHOL 113 09/29/2022 1443   TRIG 86 09/29/2022 1443   HDL 45 (L) 09/29/2022 1443   CHOLHDL 2.5 09/29/2022 1443   LDLCALC 51 09/29/2022 1443    CBC    Component Value Date/Time   WBC 7.6 09/29/2022 1443   RBC 5.24 (H) 09/29/2022 1443   HGB 9.2 (L) 09/29/2022 1443   HCT 33.9 (L) 09/29/2022 1443   PLT 206 09/29/2022 1443   MCV 64.7 (L) 09/29/2022 1443   MCH 17.6 (L) 09/29/2022 1443  MCHC 27.1 (L) 09/29/2022 1443   RDW 17.3 (H) 09/29/2022 1443   LYMPHSABS 2.6 12/08/2021 1928   MONOABS 0.6 12/08/2021 1928   EOSABS 0.1 12/08/2021 1928   BASOSABS 0.1 12/08/2021 1928    Hgb A1C Lab Results  Component Value Date   HGBA1C 5.4 09/29/2022           Assessment & Plan:  Assessment and Plan    Restless Leg Syndrome Worsening symptoms, now involving arms. Ropinirole 0.25mg  twice daily ineffective. Patient interested in trying Gabapentin. -Discontinue Ropinirole. -Start Gabapentin 300mg  twice daily. Monitor for sedation.  Left Ear Pain Persistent despite trial of Flonase and antihistamines. Patient reports pressure and possible muffled hearing. No clear signs of fluid on examination. -Consider ENT referral if symptoms persist or worsen.  Left Heel Pain Sharp, intermittent pain, possibly related to palpable bump. No associated numbness or tingling. Suspected bone spur. -Start Meloxicam 7.5mg  daily for pain control. Avoid concurrent use of ibuprofen. -Consider X-ray if pain worsens.        RTC in 2 months for follow up chronic conditions Nicki Reaper, NP

## 2023-01-16 DIAGNOSIS — R051 Acute cough: Secondary | ICD-10-CM | POA: Diagnosis not present

## 2023-01-16 DIAGNOSIS — J189 Pneumonia, unspecified organism: Secondary | ICD-10-CM | POA: Diagnosis not present

## 2023-01-22 ENCOUNTER — Other Ambulatory Visit: Payer: Self-pay | Admitting: Internal Medicine

## 2023-01-23 NOTE — Telephone Encounter (Signed)
Requested Prescriptions  Refused Prescriptions Disp Refills   rOPINIRole (REQUIP) 0.25 MG tablet [Pharmacy Med Name: ROPINIROLE HCL 0.25 MG TABLET] 180 tablet 0    Sig: TAKE 1 TABLET BY MOUTH 2 TIMES DAILY.     Neurology:  Parkinsonian Agents Passed - 01/22/2023  9:49 AM      Passed - Last BP in normal range    BP Readings from Last 1 Encounters:  01/09/23 122/68         Passed - Last Heart Rate in normal range    Pulse Readings from Last 1 Encounters:  01/09/23 86         Passed - Valid encounter within last 12 months    Recent Outpatient Visits           2 weeks ago Restless leg syndrome   [place] [facility] [place], Salvadore Oxford, NP   2 months ago Primary insomnia   [place] [facility] [place], [place] W, NP   3 months ago Gastroesophageal reflux disease without esophagitis   [facility] [place], Salvadore Oxford, NP   3 months ago Screening for cervical cancer   [facility] [place], Salvadore Oxford, NP   7 months ago Restless leg syndrome   [place] [facility] [place], Salvadore Oxford, NP       Future Appointments             In 2 months Baity, Salvadore Oxford, NP  [facility], [facility]

## 2023-02-12 ENCOUNTER — Other Ambulatory Visit: Payer: Self-pay | Admitting: Internal Medicine

## 2023-02-13 NOTE — Telephone Encounter (Signed)
Requested Prescriptions  Pending Prescriptions Disp Refills   traZODone (DESYREL) 50 MG tablet [Pharmacy Med Name: TRAZODONE 50 MG TABLET] 270 tablet 0    Sig: TAKE 3 TABLETS (150 MG TOTAL) BY MOUTH AT BEDTIME AS NEEDED FOR SLEEP.     Psychiatry: Antidepressants - Serotonin Modulator Passed - 02/12/2023  8:31 AM      Passed - Completed PHQ-2 or PHQ-9 in the last 360 days      Passed - Valid encounter within last 6 months    Recent Outpatient Visits           1 month ago Restless leg syndrome   [facility] [place], Salvadore Oxford, NP   2 months ago Primary insomnia   [facility] [place], [place] W, NP   3 months ago Gastroesophageal reflux disease without esophagitis   [place] [facility] [place], Salvadore Oxford, NP   4 months ago Screening for cervical cancer   [facility] [place], Salvadore Oxford, NP   8 months ago Restless leg syndrome   [facility] [place], Salvadore Oxford, NP       Future Appointments             In 1 month Baity, Salvadore Oxford, NP  [facility], [facility]

## 2023-03-06 ENCOUNTER — Encounter: Payer: Self-pay | Admitting: Internal Medicine

## 2023-03-06 ENCOUNTER — Ambulatory Visit (INDEPENDENT_AMBULATORY_CARE_PROVIDER_SITE_OTHER): Payer: 59 | Admitting: Internal Medicine

## 2023-03-06 VITALS — BP 118/74 | Ht 62.5 in | Wt 252.4 lb

## 2023-03-06 DIAGNOSIS — G2581 Restless legs syndrome: Secondary | ICD-10-CM | POA: Diagnosis not present

## 2023-03-06 MED ORDER — PREDNISONE 10 MG PO TABS: ORAL_TABLET | ORAL | 0 refills | Status: DC

## 2023-03-06 MED ORDER — ROPINIROLE HCL 1 MG PO TABS: 1.0000 mg | ORAL_TABLET | Freq: Two times a day (BID) | ORAL | 1 refills | Status: DC

## 2023-03-06 NOTE — Patient Instructions (Signed)
Atopic Dermatitis ?Atopic dermatitis is a skin disorder that causes inflammation of the skin. It is marked by a red rash and itchy, dry, scaly skin. It is the most common type of eczema. Eczema is a group of skin conditions that cause the skin to become rough and swollen. This condition is generally worse during the cooler winter months and often improves during the warm summer months. ?Atopic dermatitis usually starts showing signs in infancy and can last through adulthood. This condition cannot be passed from one person to another (is not contagious). Atopic dermatitis may not always be present, but when it is, it is called a flare-up. ?What are the causes? ?The exact cause of this condition is not known. Flare-ups may be triggered by: ?Coming in contact with something that you are sensitive or allergic to (allergen). ?Stress. ?Certain foods. ?Extremely hot or cold weather. ?Harsh chemicals and soaps. ?Dry air. ?Chlorine. ?What increases the risk? ?This condition is more likely to develop in people who have a personal or family history of: ?Eczema. ?Allergies. ?Asthma. ?Hay fever. ?What are the signs or symptoms? ?Symptoms of this condition include: ?Dry, scaly skin. ?Red, itchy rash. ?Itchiness, which can be severe. This may occur before the skin rash. This can make sleeping difficult. ?Skin thickening and cracking that can occur over time. ?How is this diagnosed? ?This condition is diagnosed based on: ?Your symptoms. ?Your medical history. ?A physical exam. ?How is this treated? ?There is no cure for this condition, but symptoms can usually be controlled. Treatment focuses on: ?Controlling the itchiness and scratching. You may be given medicines, such as antihistamines or steroid creams. ?Limiting exposure to allergens. ?Recognizing situations that cause stress and developing a plan to manage stress. ?If your atopic dermatitis does not get better with medicines, or if it is all over your body (widespread), a  treatment using a specific type of light (phototherapy) may be used. ?Follow these instructions at home: ?Skin care ? ?Keep your skin well moisturized. Doing this seals in moisture and helps to prevent dryness. ?Use unscented lotions that have petroleum in them. ?Avoid lotions that contain alcohol or water. They can dry the skin. ?Keep baths or showers short (less than 5 minutes) in warm water. Do not use hot water. ?Use mild, unscented cleansers for bathing. Avoid soap and bubble bath. ?Apply a moisturizer to your skin right after a bath or shower. ?Do not apply anything to your skin without checking with your health care provider. ?General instructions ?Take or apply over-the-counter and prescription medicines only as told by your health care provider. ?Dress in clothes made of cotton or cotton blends. Dress lightly because heat increases itchiness. ?When washing your clothes, rinse your clothes twice so all of the soap is removed. ?Avoid any triggers that can cause a flare-up. ?Keep your fingernails cut short. ?Avoid scratching. Scratching makes the rash and itchiness worse. A break in the skin from scratching could result in a skin infection (impetigo). ?Do not be around people who have cold sores or fever blisters. If you get the infection, it may cause your atopic dermatitis to worsen. ?Keep all follow-up visits. This is important. ?Contact a health care provider if: ?Your itchiness interferes with sleep. ?Your rash gets worse or is not better within one week of starting treatment. ?You have a fever. ?You have a rash flare-up after having contact with someone who has cold sores or fever blisters. ?Get help right away if: ?You develop pus or soft yellow scabs in the rash   area. ?Summary ?Atopic dermatitis causes a red rash and itchy, dry, scaly skin. ?Treatment focuses on controlling the itchiness and scratching, limiting exposure to things that you are sensitive or allergic to (allergens), recognizing  situations that cause stress, and developing a plan to manage stress. ?Keep your skin well moisturized. ?Keep baths or showers shorter than 5 minutes and use warm water. Do not use hot water. ?This information is not intended to replace advice given to you by your health care provider. Make sure you discuss any questions you have with your health care provider. ?Document Revised: 12/30/2019 Document Reviewed: 12/30/2019 ?Elsevier Patient Education ? 2023 Elsevier Inc. ? ?

## 2023-03-06 NOTE — Progress Notes (Signed)
HPI  Discussed the use of AI scribe software for clinical note transcription with the patient, who gave verbal consent to proceed.  The patient, employed in an environment with a recent scabies outbreak, presents with a four-week history of generalized pruritus and a two-week history of a rash. The rash is primarily located on the upper body, with transient bumps appearing and disappearing on the arms. The patient denies any rash or itching around the ankles. The patient also reports itching on the back, but it is unclear if there is a rash in that location.  She has applied lotion once but has not taken any medications OTC or any other treatments.  She has not come in contact with anything that she is allergic to.  She also reports she would like to switch back to ropinirole from gabapentin for her restless legs.  She reports the gabapentin is ineffective.  She has been taking 4 0.25 mg of leftover ropinirole that seems to be working well.  She would like a new Rx for this.      Past Medical History:  Diagnosis Date   Migraines     Current Outpatient Medications  Medication Sig Dispense Refill   gabapentin (NEURONTIN) 300 MG capsule Take 1 capsule (300 mg total) by mouth 2 (two) times daily. 180 capsule 1   hydrOXYzine (VISTARIL) 25 MG capsule TAKE 1 CAPSULE (25 MG TOTAL) BY MOUTH AT BEDTIME AS NEEDED. 90 capsule 1   meloxicam (MOBIC) 7.5 MG tablet Take 1 tablet (7.5 mg total) by mouth daily. 90 tablet 1   omeprazole (PRILOSEC) 20 MG capsule Take 1 capsule (20 mg total) by mouth daily. 90 capsule 1   traZODone (DESYREL) 50 MG tablet TAKE 3 TABLETS (150 MG TOTAL) BY MOUTH AT BEDTIME AS NEEDED FOR SLEEP. 270 tablet 0   No current facility-administered medications for this visit.    No Known Allergies  Family History  Problem Relation Age of Onset   Anxiety disorder Mother    Depression Mother    Heart attack Father 52   Alcohol abuse Father    Alcohol abuse Sister    Lung cancer  Maternal Grandmother     Social History   Socioeconomic History   Marital status: Married    Spouse name: Not on file   Number of children: Not on file   Years of education: Not on file   Highest education level: Not on file  Occupational History   Not on file  Tobacco Use   Smoking status: Every Day    Current packs/day: 1.00    Average packs/day: 1 pack/day for 18.0 years (18.0 ttl pk-yrs)    Types: Cigarettes   Smokeless tobacco: Never  Vaping Use   Vaping status: Never Used  Substance and Sexual Activity   Alcohol use: Yes    Comment: social   Drug use: Not Currently   Sexual activity: Not on file  Other Topics Concern   Not on file  Social History Narrative   Not on file   Social Determinants of Health   Financial Resource Strain: Not on file  Food Insecurity: Not on file  Transportation Needs: Not on file  Physical Activity: Not on file  Stress: Not on file  Social Connections: Not on file  Intimate Partner Violence: Not on file    ROS:  Constitutional: Patient reports intermittent headaches.  Denies fever, malaise, fatigue, or abrupt weight changes.  HEENT: Denies eye pain, eye redness, ear pain, ringing in the  ears, wax buildup, runny nose, nasal congestion, bloody nose, or sore throat. Respiratory:  Denies difficulty breathing, shortness of breath, cough or sputum production.   Cardiovascular: Denies chest pain, chest tightness, palpitations or swelling in the hands or feet.  Skin: Patient reports rash.  Denies lesions or ulcercations.  Neurological: Patient reports restless legs, insomnia.  Denies dizziness, difficulty with memory, difficulty with speech or problems with balance and coordination.  Psych: Patient has a history of anxiety and depression.  Denies SI/HI.  No other specific complaints in a complete review of systems (except as listed in HPI above).  PE:   Wt Readings from Last 3 Encounters:  01/09/23 253 lb (114.8 kg)  10/24/22 257 lb  3.2 oz (116.7 kg)  09/29/22 262 lb (118.8 kg)    General: Appears her stated age, obese in NAD. Skin: Convalescent scaly maculopapular rash noted of the upper chest. Cardiovascular: Normal rate and rhythm.  Pulmonary/Chest: Normal effort and positive vesicular breath sounds. No respiratory distress. No wheezes, rales or ronchi noted.  Neurological: Alert and oriented. Coordination normal.    BMET    Component Value Date/Time   NA 140 09/29/2022 1443   K 4.3 09/29/2022 1443   CL 108 09/29/2022 1443   CO2 26 09/29/2022 1443   GLUCOSE 91 09/29/2022 1443   BUN 18 09/29/2022 1443   CREATININE 0.79 09/29/2022 1443   CALCIUM 9.2 09/29/2022 1443   GFRNONAA >60 04/12/2022 2056   GFRAA >60 10/14/2019 1233    Lipid Panel     Component Value Date/Time   CHOL 113 09/29/2022 1443   TRIG 86 09/29/2022 1443   HDL 45 (L) 09/29/2022 1443   CHOLHDL 2.5 09/29/2022 1443   LDLCALC 51 09/29/2022 1443    CBC    Component Value Date/Time   WBC 7.6 09/29/2022 1443   RBC 5.24 (H) 09/29/2022 1443   HGB 9.2 (L) 09/29/2022 1443   HCT 33.9 (L) 09/29/2022 1443   PLT 206 09/29/2022 1443   MCV 64.7 (L) 09/29/2022 1443   MCH 17.6 (L) 09/29/2022 1443   MCHC 27.1 (L) 09/29/2022 1443   RDW 17.3 (H) 09/29/2022 1443   LYMPHSABS 2.6 12/08/2021 1928   MONOABS 0.6 12/08/2021 1928   EOSABS 0.1 12/08/2021 1928   BASOSABS 0.1 12/08/2021 1928    Hgb A1C Lab Results  Component Value Date   HGBA1C 5.4 09/29/2022     Assessment and Plan:  Atopic dermatitis:  Rx for Pred taper x 6 days Avoid taking hot showers or baths as this can exacerbate symptoms Consider taking daily antihistamine such as Claritin, Allegra or Zyrtec OTC Apply moisturizing lotion such as Aveeno, Cetaphil or Eucerin twice daily  RTC in 1 month for follow-up of chronic conditions Nicki Reaper, NP

## 2023-03-06 NOTE — Assessment & Plan Note (Signed)
Will discontinue gabapentin due to ineffectiveness Rx for ropinirole 1 mg twice daily

## 2023-03-10 ENCOUNTER — Ambulatory Visit: Payer: Medicaid Other | Admitting: Internal Medicine

## 2023-03-22 ENCOUNTER — Telehealth (INDEPENDENT_AMBULATORY_CARE_PROVIDER_SITE_OTHER): Payer: 59 | Admitting: Internal Medicine

## 2023-03-22 ENCOUNTER — Encounter: Payer: Self-pay | Admitting: Internal Medicine

## 2023-03-22 DIAGNOSIS — R21 Rash and other nonspecific skin eruption: Secondary | ICD-10-CM

## 2023-03-22 MED ORDER — PERMETHRIN 5 % EX CREA: TOPICAL_CREAM | CUTANEOUS | 1 refills | Status: DC

## 2023-03-22 NOTE — Progress Notes (Signed)
Virtual Visit via Video Note  I connected with Terri Conner on 03/22/23 at  4:00 PM EST by a video enabled telemedicine application and verified that I am speaking with the correct person using two identifiers.  Location: Patient: Work Provider: Office  Persons participating in this video call: Nicki Reaper, NP and [name]   I discussed the limitations of evaluation and management by telemedicine and the availability of in person appointments. The patient expressed understanding and agreed to proceed.  History of Present Illness:  Discussed the use of AI scribe software for clinical note transcription with the patient, who gave verbal consent to proceed.    The patient, previously diagnosed with eczema, presents with a persistent, widespread rash that has been unresponsive to prednisone treatment. The rash, initially located on the upper body, has now spread to cover the entire body, causing significant itching. The patient reports that the itching is so severe that she has scratched some areas to the point of opening the skin. Despite the use of [place], the patient reports only minimal relief from the itching.  The patient works in an environment where there was a recent outbreak of scabies, raising the possibility of this being the cause of the current symptoms. However, the patient's spouse, who shares the same bed, has not exhibited any similar symptoms. The patient has been taking hydroxyzine for sleep, but it has not been effective in managing the itching.  The patient's rash and itching have not improved with prednisone treatment, leading to the consideration of other potential causes beyond an allergic reaction or eczema. The patient's work environment and the widespread nature of the rash have led to the consideration of scabies as a potential diagnosis.      Past Medical History:  Diagnosis Date   Migraines     Current Outpatient Medications  Medication Sig Dispense  Refill   hydrOXYzine (VISTARIL) 25 MG capsule TAKE 1 CAPSULE (25 MG TOTAL) BY MOUTH AT BEDTIME AS NEEDED. 90 capsule 1   meloxicam (MOBIC) 7.5 MG tablet Take 1 tablet (7.5 mg total) by mouth daily. 90 tablet 1   omeprazole (PRILOSEC) 20 MG capsule Take 1 capsule (20 mg total) by mouth daily. 90 capsule 1   predniSONE (DELTASONE) 10 MG tablet Take 6 tabs on day 1, 5 tabs on day 2, 4 tabs on day 3, 3 tabs on day 4, 2 tabs on day 5, 1 tab on day 6 21 tablet 0   rOPINIRole (REQUIP) 1 MG tablet Take 1 tablet (1 mg total) by mouth 2 (two) times daily. 180 tablet 1   traZODone (DESYREL) 50 MG tablet TAKE 3 TABLETS (150 MG TOTAL) BY MOUTH AT BEDTIME AS NEEDED FOR SLEEP. 270 tablet 0   No current facility-administered medications for this visit.    No Known Allergies  Family History  Problem Relation Age of Onset   Anxiety disorder Mother    Depression Mother    Heart attack Father 75   Alcohol abuse Father    Alcohol abuse Sister    Lung cancer Maternal Grandmother     Social History   Socioeconomic History   Marital status: Married    Spouse name: Not on file   Number of children: Not on file   Years of education: Not on file   Highest education level: Not on file  Occupational History   Not on file  Tobacco Use   Smoking status: Every Day    Average packs/day: 1 pack/day  for 18.0 years (18.0 ttl pk-yrs)    Types: Cigarettes    Start date: 02/12/2005   Smokeless tobacco: Former  Building services engineer status: Never Used  Substance and Sexual Activity   Alcohol use: Yes    Comment: social   Drug use: Not Currently   Sexual activity: Not on file  Other Topics Concern   Not on file  Social History Narrative   Not on file   Social Drivers of Health   Financial Resource Strain: Medium Risk (03/06/2023)   Overall Financial Resource Strain (CARDIA)    Difficulty of Paying Living Expenses: Somewhat hard  Food Insecurity: Food Insecurity Present (03/06/2023)   Hunger Vital Sign     Worried About Running Out of Food in the Last Year: Never true    Ran Out of Food in the Last Year: Sometimes true  Transportation Needs: No Transportation Needs (03/06/2023)   PRAPARE - Administrator, Civil Service (Medical): No    Lack of Transportation (Non-Medical): No  Physical Activity: Inactive (03/06/2023)   Exercise Vital Sign    Days of Exercise per Week: 0 days    Minutes of Exercise per Session: 0 min  Stress: No Stress Concern Present (03/06/2023)   [employer] of Occupational Health - Occupational Stress Questionnaire    Feeling of Stress : Not at all  Social Connections: Moderately Isolated (03/06/2023)   Social Connection and Isolation Panel [NHANES]    Frequency of Communication with Friends and Family: More than three times a week    Frequency of Social Gatherings with Friends and Family: Once a week    Attends Religious Services: Never    Database administrator or Organizations: No    Attends Banker Meetings: Never    Marital Status: Married  Catering manager Violence: Not At Risk (03/06/2023)   Humiliation, Afraid, Rape, and Kick questionnaire    Fear of Current or Ex-Partner: No    Emotionally Abused: No    Physically Abused: No    Sexually Abused: No     Constitutional: Denies fever, malaise, fatigue, headache or abrupt weight changes.  Respiratory: Denies difficulty breathing, shortness of breath, cough or sputum production.   Cardiovascular: Denies chest pain, chest tightness, palpitations or swelling in the hands or feet.  Skin: Pt reports rash. Denies redness, lesions or ulcercations.    No other specific complaints in a complete review of systems (except as listed in HPI above).    Observations/Objective:  LMP 01/14/2019 (Exact Date)  Wt Readings from Last 3 Encounters:  03/06/23 252 lb 6.4 oz (114.5 kg)  01/09/23 253 lb (114.8 kg)  10/24/22 257 lb 3.2 oz (116.7 kg)    General: Appears her stated age, obese, in  NAD. Skin: Scattered maculopapular rash with excoriation noted of upper chest, neck and arms. Pulmonary/Chest: Normal effort. No respiratory distress.  Neurological: Alert and oriented.   BMET    Component Value Date/Time   NA 140 09/29/2022 1443   K 4.3 09/29/2022 1443   CL 108 09/29/2022 1443   CO2 26 09/29/2022 1443   GLUCOSE 91 09/29/2022 1443   BUN 18 09/29/2022 1443   CREATININE 0.79 09/29/2022 1443   CALCIUM 9.2 09/29/2022 1443   GFRNONAA >60 04/12/2022 2056   GFRAA >60 10/14/2019 1233    Lipid Panel     Component Value Date/Time   CHOL 113 09/29/2022 1443   TRIG 86 09/29/2022 1443   HDL 45 (L) 09/29/2022 1443  CHOLHDL 2.5 09/29/2022 1443   LDLCALC 51 09/29/2022 1443    CBC    Component Value Date/Time   WBC 7.6 09/29/2022 1443   RBC 5.24 (H) 09/29/2022 1443   HGB 9.2 (L) 09/29/2022 1443   HCT 33.9 (L) 09/29/2022 1443   PLT 206 09/29/2022 1443   MCV 64.7 (L) 09/29/2022 1443   MCH 17.6 (L) 09/29/2022 1443   MCHC 27.1 (L) 09/29/2022 1443   RDW 17.3 (H) 09/29/2022 1443   LYMPHSABS 2.6 12/08/2021 1928   MONOABS 0.6 12/08/2021 1928   EOSABS 0.1 12/08/2021 1928   BASOSABS 0.1 12/08/2021 1928    Hgb A1C Lab Results  Component Value Date   HGBA1C 5.4 09/29/2022       Assessment and Plan:  Assessment and Plan    Generalized Pruritic Rash Initially diagnosed as eczema, however, no improvement with prednisone. Patient works in an environment with known scabies exposure. Rash is widespread and severe with areas of excoriation. -Start Permethrin cream, apply to entire body excluding face, rinse off after 10 minutes. Repeat in 1 week if no improvement. -Continue Hydroxyzine 25mg  at bedtime for itching, may increase to every 8 hours if tolerated. -Start Zyrtec 10mg  daily for additional antihistamine coverage. -Reevaluate if no improvement. Consider dermatology referral if necessary.       Follow Up Instructions:    I discussed the assessment and  treatment plan with the patient. The patient was provided an opportunity to ask questions and all were answered. The patient agreed with the plan and demonstrated an understanding of the instructions.   The patient was advised to call back or seek an in-person evaluation if the symptoms worsen or if the condition fails to improve as anticipated.    Nicki Reaper, NP

## 2023-03-22 NOTE — Patient Instructions (Signed)
Rash, Adult  A rash is a breakout of spots or blotches on the skin. It can change the way your skin looks and feels. Many things can cause a rash. The goal of treatment is to stop the itching and keep the rash from spreading. Follow these instructions at home: Medicine Take or apply over-the-counter and prescription medicines only as told by your doctor. These may include medicines to treat: Red or swollen skin. Itching. An allergy. Pain. An infection.  Skin care Put a cool, wet cloth on the rash. Do not scratch or rub your skin. Try not to cover the rash. Keep it exposed to air as often as you can. Managing itching and discomfort Avoid hot showers or baths. These can make itching worse. A cold shower may help. Try taking a bath with: Epsom salts. You can get these at your pharmacy or grocery store. Follow the instructions on the package. Baking soda. Pour a small amount into the bath as told by your doctor. Colloidal oatmeal. You can get this at your pharmacy or grocery store. Follow the instructions on the package. Try putting baking soda paste on your skin. Stir water into baking soda until it gets like a paste. Try putting on a lotion to help with itching (calamine lotion). Keep cool. Stay out of the sun. Sweating and being hot can make itching worse. General instructions  Rest as needed. Drink enough fluid to keep your pee (urine) pale yellow. Wear loose-fitting clothes. Avoid scented soaps, detergents, and perfumes. Use gentle soaps, detergents, perfumes, and cosmetics. Avoid the things that cause your rash. Keep a journal to help keep track of what causes your rash. Write down: What you eat. What cosmetics you use. What you drink. What you wear. This includes jewelry. Contact a doctor if: You sweat a lot at night. You pee (urinate) more or less than normal. Your pee is a darker color than normal. Your eyes are sensitive to light. Your skin or the white parts of your  eyes turn yellow. Your skin tingles or is numb. You get painful blisters in your nose or mouth. Your rash does not go away after a few days, or it gets worse. You are more tired than normal. You are more thirsty than normal. You have new or worse symptoms. These may include: Pain in your belly. A fever. Watery poop (diarrhea). Vomiting. Weakness. Weight loss. Get help right away if: You start to feel mixed up (confused). You have a very bad headache or a stiff neck. You have very bad joint pain or stiffness. You get very sleepy or not responsive. You have a seizure. This information is not intended to replace advice given to you by your health care provider. Make sure you discuss any questions you have with your health care provider. Document Revised: 01/07/2022 Document Reviewed: 01/07/2022 Elsevier Patient Education  2024 Elsevier Inc.  

## 2023-03-31 ENCOUNTER — Ambulatory Visit: Payer: Medicaid Other | Admitting: Internal Medicine

## 2023-04-03 ENCOUNTER — Ambulatory Visit: Payer: 59 | Admitting: Internal Medicine

## 2023-04-03 NOTE — Progress Notes (Deleted)
History of Present Illness:  Discussed the use of AI scribe software for clinical note transcription with the patient, who gave verbal consent to proceed.    The patient, previously diagnosed with eczema, presents with a persistent, widespread rash that has been unresponsive to prednisone treatment. The rash, initially located on the upper body, has now spread to cover the entire body, causing significant itching. The patient reports that the itching is so severe that she has scratched some areas to the point of opening the skin. Despite the use of Gold Bond, the patient reports only minimal relief from the itching.  The patient works in an environment where there was a recent outbreak of scabies, raising the possibility of this being the cause of the current symptoms. However, the patient's spouse, who shares the same bed, has not exhibited any similar symptoms. The patient has been taking hydroxyzine for sleep, but it has not been effective in managing the itching.  The patient's rash and itching have not improved with prednisone treatment, leading to the consideration of other potential causes beyond an allergic reaction or eczema. The patient's work environment and the widespread nature of the rash have led to the consideration of scabies as a potential diagnosis.      Past Medical History:  Diagnosis Date   Migraines     Current Outpatient Medications  Medication Sig Dispense Refill   hydrOXYzine (VISTARIL) 25 MG capsule TAKE 1 CAPSULE (25 MG TOTAL) BY MOUTH AT BEDTIME AS NEEDED. 90 capsule 1   meloxicam (MOBIC) 7.5 MG tablet Take 1 tablet (7.5 mg total) by mouth daily. 90 tablet 1   omeprazole (PRILOSEC) 20 MG capsule Take 1 capsule (20 mg total) by mouth daily. 90 capsule 1   permethrin (ELIMITE) 5 % cream Apply cream whole body, from head to toe at bedtime; leave on for 8 to 12 hours (overnight), wash off next day. Repeat in 14 days if needed. 60 g 1   predniSONE (DELTASONE) 10 MG  tablet Take 6 tabs on day 1, 5 tabs on day 2, 4 tabs on day 3, 3 tabs on day 4, 2 tabs on day 5, 1 tab on day 6 21 tablet 0   rOPINIRole (REQUIP) 1 MG tablet Take 1 tablet (1 mg total) by mouth 2 (two) times daily. 180 tablet 1   traZODone (DESYREL) 50 MG tablet TAKE 3 TABLETS (150 MG TOTAL) BY MOUTH AT BEDTIME AS NEEDED FOR SLEEP. 270 tablet 0   No current facility-administered medications for this visit.    No Known Allergies  Family History  Problem Relation Age of Onset   Anxiety disorder Mother    Depression Mother    Heart attack Father 32   Alcohol abuse Father    Alcohol abuse Sister    Lung cancer Maternal Grandmother     Social History   Socioeconomic History   Marital status: Married    Spouse name: Not on file   Number of children: Not on file   Years of education: Not on file   Highest education level: Not on file  Occupational History   Not on file  Tobacco Use   Smoking status: Every Day    Average packs/day: 1 pack/day for 18.0 years (18.0 ttl pk-yrs)    Types: Cigarettes    Start date: 02/12/2005   Smokeless tobacco: Former  Building services engineer status: Never Used  Substance and Sexual Activity   Alcohol use: Yes    Comment: social  Drug use: Not Currently   Sexual activity: Not on file  Other Topics Concern   Not on file  Social History Narrative   Not on file   Social Drivers of Health   Financial Resource Strain: Medium Risk (03/06/2023)   Overall Financial Resource Strain (CARDIA)    Difficulty of Paying Living Expenses: Somewhat hard  Food Insecurity: Food Insecurity Present (03/06/2023)   Hunger Vital Sign    Worried About Running Out of Food in the Last Year: Never true    Ran Out of Food in the Last Year: Sometimes true  Transportation Needs: No Transportation Needs (03/06/2023)   PRAPARE - Administrator, Civil Service (Medical): No    Lack of Transportation (Non-Medical): No  Physical Activity: Inactive (03/06/2023)    Exercise Vital Sign    Days of Exercise per Week: 0 days    Minutes of Exercise per Session: 0 min  Stress: No Stress Concern Present (03/06/2023)   [employer] of Occupational Health - Occupational Stress Questionnaire    Feeling of Stress : Not at all  Social Connections: Moderately Isolated (03/06/2023)   Social Connection and Isolation Panel [NHANES]    Frequency of Communication with Friends and Family: More than three times a week    Frequency of Social Gatherings with Friends and Family: Once a week    Attends Religious Services: Never    Database administrator or Organizations: No    Attends Banker Meetings: Never    Marital Status: Married  Catering manager Violence: Not At Risk (03/06/2023)   Humiliation, Afraid, Rape, and Kick questionnaire    Fear of Current or Ex-Partner: No    Emotionally Abused: No    Physically Abused: No    Sexually Abused: No     Constitutional: Denies fever, malaise, fatigue, headache or abrupt weight changes.  Respiratory: Denies difficulty breathing, shortness of breath, cough or sputum production.   Cardiovascular: Denies chest pain, chest tightness, palpitations or swelling in the hands or feet.  Skin: Pt reports rash. Denies redness, lesions or ulcercations.    No other specific complaints in a complete review of systems (except as listed in HPI above).    Observations/Objective:  LMP 01/14/2019 (Exact Date)  Wt Readings from Last 3 Encounters:  03/06/23 252 lb 6.4 oz (114.5 kg)  01/09/23 253 lb (114.8 kg)  10/24/22 257 lb 3.2 oz (116.7 kg)    General: Appears her stated age, obese, in NAD. Skin: Scattered maculopapular rash with excoriation noted of upper chest, neck and arms. Pulmonary/Chest: Normal effort. No respiratory distress.  Neurological: Alert and oriented.   BMET    Component Value Date/Time   NA 140 09/29/2022 1443   K 4.3 09/29/2022 1443   CL 108 09/29/2022 1443   CO2 26 09/29/2022 1443    GLUCOSE 91 09/29/2022 1443   BUN 18 09/29/2022 1443   CREATININE 0.79 09/29/2022 1443   CALCIUM 9.2 09/29/2022 1443   GFRNONAA >60 04/12/2022 2056   GFRAA >60 10/14/2019 1233    Lipid Panel     Component Value Date/Time   CHOL 113 09/29/2022 1443   TRIG 86 09/29/2022 1443   HDL 45 (L) 09/29/2022 1443   CHOLHDL 2.5 09/29/2022 1443   LDLCALC 51 09/29/2022 1443    CBC    Component Value Date/Time   WBC 7.6 09/29/2022 1443   RBC 5.24 (H) 09/29/2022 1443   HGB 9.2 (L) 09/29/2022 1443   HCT 33.9 (L) 09/29/2022 1443  PLT 206 09/29/2022 1443   MCV 64.7 (L) 09/29/2022 1443   MCH 17.6 (L) 09/29/2022 1443   MCHC 27.1 (L) 09/29/2022 1443   RDW 17.3 (H) 09/29/2022 1443   LYMPHSABS 2.6 12/08/2021 1928   MONOABS 0.6 12/08/2021 1928   EOSABS 0.1 12/08/2021 1928   BASOSABS 0.1 12/08/2021 1928    Hgb A1C Lab Results  Component Value Date   HGBA1C 5.4 09/29/2022       Assessment and Plan:  Assessment and Plan               Nicki Reaper, NP

## 2023-04-18 ENCOUNTER — Encounter: Payer: Self-pay | Admitting: Internal Medicine

## 2023-04-18 ENCOUNTER — Telehealth: Payer: Self-pay

## 2023-04-18 ENCOUNTER — Ambulatory Visit (INDEPENDENT_AMBULATORY_CARE_PROVIDER_SITE_OTHER): Payer: 59 | Admitting: Internal Medicine

## 2023-04-18 VITALS — BP 114/68 | Ht 62.5 in | Wt 252.0 lb

## 2023-04-18 DIAGNOSIS — E66813 Obesity, class 3: Secondary | ICD-10-CM

## 2023-04-18 DIAGNOSIS — M25521 Pain in right elbow: Secondary | ICD-10-CM | POA: Diagnosis not present

## 2023-04-18 DIAGNOSIS — Z6841 Body Mass Index (BMI) 40.0 and over, adult: Secondary | ICD-10-CM

## 2023-04-18 DIAGNOSIS — R200 Anesthesia of skin: Secondary | ICD-10-CM

## 2023-04-18 DIAGNOSIS — N644 Mastodynia: Secondary | ICD-10-CM | POA: Diagnosis not present

## 2023-04-18 MED ORDER — SEMAGLUTIDE-WEIGHT MANAGEMENT 0.25 MG/0.5ML ~~LOC~~ SOAJ: 0.2500 mg | SUBCUTANEOUS | 0 refills | Status: DC

## 2023-04-18 NOTE — Patient Instructions (Signed)
Breast Scan A breast scan is an imaging test that is done to examine dense breast tissue. A breast scan is different from a mammogram. This scan is done using a radioactive material that produces images of the breast. A breast scan is used in people who have breast lesions that result from: Rope-like, lumpy tissue (fibrocystic disease). Solid, painless lumps (fibroadenoma). Damaged fatty breast tissue (fat necrosis). A breast scan may also be done to find out the best treatment for people who have breast cancer. Tell a health care provider about: Any allergies you have to medicines, contrast dyes, shellfish, or iodine. All medicines you are taking, including vitamins, herbs, eye drops, creams, and over-the-counter medicines. Any problems you or family members have had with anesthetic medicines. Any bleeding problems you have. Any surgeries you have had. Any medical conditions you have. Whether you are pregnant or may be pregnant. Whether you are breastfeeding. What are the risks? Your health care provider will talk with you about risks. These may include: Slight discomfort from the injection of a radioactive substance. Allergic reaction to the radioactive substance used during the test. What happens before the test? Ask your health care provider about changing or stopping your regular medicines. These include any diabetes medicines or blood thinners you take. What happens during the test?  You will be asked to remove all jewelry and clothing from the waist up. An IV will be inserted into one of your veins. You will be asked to lie face-down on a table. The breast that will be scanned will be placed through an opening in the table. You may also be asked to get into different positions during the scan. The radioactive agent will be injected into the IV. You may have a slight metallic taste in your mouth after the injection. A scanner will be placed over the breast to record the radiation,  which produces an image of the breast. The procedure may be repeated on the other breast. When the scan is complete, the IV will be removed. The procedure may vary among health care providers and hospitals. What can I expect after the test? You will be asked to get up slowly. This helps you avoid light-headedness after lying flat during the test. It is up to you to get the results of your procedure. Ask your health care provider, or the department that is doing the procedure, when your results will be ready. Follow these instructions at home: Drink enough fluid to keep your urine pale yellow. This helps to flush out the remaining radioactive substance from your body. Keep all follow-up visits. Your health care provider will talk with you about the results and if treatment is needed. This information is not intended to replace advice given to you by your health care provider. Make sure you discuss any questions you have with your health care provider. Document Revised: 09/08/2021 Document Reviewed: 09/08/2021 Elsevier Patient Education  2024 [employer].

## 2023-04-18 NOTE — Telephone Encounter (Signed)
 Gagandeep Kossman Markwell (KeyBETHA LEYS) Rx #: [id number] Wegovy  0.25MG /0.5ML auto-injectors Form [place] [facility] Man[facility] Medicaid Electronic Prior Authorization Request Form Created 43 minutes ago Sent to Plan 30 minutes ago Plan Response 30 minutes ago Submit Clinical Questions 25 minutes ago Determination Favorable 2 minutes ago Your prior authorization for Wegovy  has been approved! More Info Personalized support and financial assistance may be available through the [employer] program. For more information, and to see program requirements, click on the More Info button to the right.  Message from plan: Approved. Approved for WEGOVY  Soln Auto-inj 0.25MG /0.5ML, quantity up to 2 per 30 days, under the pharmacy benefit. The drug has been approved from 04/18/2023 to 10/15/2023. Generic or biosimilar substitution may be required when available and preferred on the formulary. Please note that dispensing of non-maintenance and specialty medications may be limited to a monthly supply.. Authorization Expiration Date: October 15, 2023.

## 2023-04-18 NOTE — Telephone Encounter (Signed)
 Darriana Deboy Koziel (KeyBETHA LEYS) Rx #: [id number] Wegovy  0.25MG /0.5ML auto-injectors Form [facility] Man[facility] Medicaid Electronic Prior Authorization Request Form Created 18 minutes ago Sent to Plan 5 minutes ago Plan Response 5 minutes ago Submit Clinical Questions less than a minute ago Determination Wait for Determination Please wait for [place] [facility] MCD 2017 to return a determination.

## 2023-04-18 NOTE — Progress Notes (Signed)
 Subjective:    Patient ID: Terri Conner, female    DOB: [birth year], 53 y.o.   MRN: [id number]  HPI  Discussed the use of AI scribe software for clinical note transcription with the patient, who gave verbal consent to proceed.   The patient presents with a newly discovered lump in the right breast, noticed approximately a week ago. The patient was hoping it was an inflammation that would resolve, but it remains palpable and tender. The patient also reports a concurrent skin rash, but denies any nipple discharge. The last mammogram was in 2020. There is a family history of unspecified female cancer.  In addition to the breast lump, the patient reports intermittent right elbow pain that has been present for about three days. The pain is described as shooting down the forearm, with some perceived swelling. The patient denies any known trauma to the area and has been managing the pain with occasional use of over-the-counter analgesics.  The patient also reports chronic numbness in the leftthigh, which has been self-managed until now. However, with a new standing position at work, the patient experiences intense pain in the area if standing for too long, described as 'like a thousand hot needles.' The pain is occasionally present on the right side as well, but predominantly affects the right. The patient has a history of moderate degenerative disc disease and facet hypertrophy at L4, L5, and L5 S1, and suspects the numbness may be related to this.   Review of Systems   Past Medical History:  Diagnosis Date   Migraines     Current Outpatient Medications  Medication Sig Dispense Refill   hydrOXYzine  (VISTARIL ) 25 MG capsule TAKE 1 CAPSULE (25 MG TOTAL) BY MOUTH AT BEDTIME AS NEEDED. 90 capsule 1   meloxicam  (MOBIC ) 7.5 MG tablet Take 1 tablet (7.5 mg total) by mouth daily. 90 tablet 1   omeprazole  (PRILOSEC) 20 MG capsule Take 1 capsule (20 mg total) by mouth daily. 90 capsule 1   permethrin   (ELIMITE ) 5 % cream Apply cream whole body, from head to toe at bedtime; leave on for 8 to 12 hours (overnight), wash off next day. Repeat in 14 days if needed. 60 g 1   predniSONE  (DELTASONE ) 10 MG tablet Take 6 tabs on day 1, 5 tabs on day 2, 4 tabs on day 3, 3 tabs on day 4, 2 tabs on day 5, 1 tab on day 6 21 tablet 0   rOPINIRole  (REQUIP ) 1 MG tablet Take 1 tablet (1 mg total) by mouth 2 (two) times daily. 180 tablet 1   traZODone  (DESYREL ) 50 MG tablet TAKE 3 TABLETS (150 MG TOTAL) BY MOUTH AT BEDTIME AS NEEDED FOR SLEEP. 270 tablet 0   No current facility-administered medications for this visit.    No Known Allergies  Family History  Problem Relation Age of Onset   Anxiety disorder Mother    Depression Mother    Heart attack Father 45   Alcohol abuse Father    Alcohol abuse Sister    Lung cancer Maternal Grandmother     Social History   Socioeconomic History   Marital status: Married    Spouse name: Not on file   Number of children: Not on file   Years of education: Not on file   Highest education level: Not on file  Occupational History   Not on file  Tobacco Use   Smoking status: Every Day    Average packs/day: 1 pack/day for 18.0 years (  18.0 ttl pk-yrs)    Types: Cigarettes    Start date: 02/12/2005   Smokeless tobacco: Former  Building Services Engineer status: Never Used  Substance and Sexual Activity   Alcohol use: Yes    Comment: social   Drug use: Not Currently   Sexual activity: Not on file  Other Topics Concern   Not on file  Social History Narrative   Not on file   Social Drivers of Health   Financial Resource Strain: Medium Risk (03/06/2023)   Overall Financial Resource Strain (CARDIA)    Difficulty of Paying Living Expenses: Somewhat hard  Food Insecurity: Food Insecurity Present (03/06/2023)   Hunger Vital Sign    Worried About Running Out of Food in the Last Year: Never true    Ran Out of Food in the Last Year: Sometimes true  Transportation Needs:  No Transportation Needs (03/06/2023)   PRAPARE - Administrator, Civil Service (Medical): No    Lack of Transportation (Non-Medical): No  Physical Activity: Inactive (03/06/2023)   Exercise Vital Sign    Days of Exercise per Week: 0 days    Minutes of Exercise per Session: 0 min  Stress: No Stress Concern Present (03/06/2023)   [employer] of Occupational Health - Occupational Stress Questionnaire    Feeling of Stress : Not at all  Social Connections: Moderately Isolated (03/06/2023)   Social Connection and Isolation Panel [NHANES]    Frequency of Communication with Friends and Family: More than three times a week    Frequency of Social Gatherings with Friends and Family: Once a week    Attends Religious Services: Never    Database Administrator or Organizations: No    Attends Banker Meetings: Never    Marital Status: Married  Catering Manager Violence: Not At Risk (03/06/2023)   Humiliation, Afraid, Rape, and Kick questionnaire    Fear of Current or Ex-Partner: No    Emotionally Abused: No    Physically Abused: No    Sexually Abused: No     Constitutional: Denies fever, malaise, fatigue, headache or abrupt weight changes.  HEENT: Denies eye pain, eye redness, ear pain, ringing in the ears, wax buildup, runny nose, nasal congestion, bloody nose, or sore throat. Respiratory: Denies difficulty breathing, shortness of breath, cough or sputum production.   Cardiovascular: Denies chest pain, chest tightness, palpitations or swelling in the hands or feet.  Gastrointestinal: Denies abdominal pain, bloating, constipation, diarrhea or blood in the stool.  GU: Denies urgency, frequency, pain with urination, burning sensation, blood in urine, odor or discharge. Musculoskeletal: Pt reports right elbow pain, chronic low back pain. Denies decrease in range of motion, difficulty with gait, muscle pain or joint swelling.  Skin: Pt reports lump of right breast. Denies  redness, rashes, lesions or ulcercations.  Neurological: Patient reports numbness of left thigh.  Denies dizziness, difficulty with memory, difficulty with speech or problems with balance and coordination.  Psych: Denies anxiety, depression, SI/HI.  No other specific complaints in a complete review of systems (except as listed in HPI above).      Objective:   Physical Exam  BP 114/68 (BP Location: Left Arm, Patient Position: Sitting, Cuff Size: Large)   Ht 5' 2.5 (1.588 m)   Wt 252 lb (114.3 kg)   LMP 01/14/2019 (Exact Date)   BMI 45.36 kg/m   Wt Readings from Last 3 Encounters:  03/06/23 252 lb 6.4 oz (114.5 kg)  01/09/23 253 lb (114.8 kg)  10/24/22 257 lb 3.2 oz (116.7 kg)    General: Appears her stated age, obese, in NAD. Breast: Symmetrical.  Fibrocystic changes noted bilaterally without discrete mass.  No dimpling of the skin or discharge from the nipple.  No axillary adenopathy. Cardiovascular: Normal rate and rhythm. S1,S2 noted.  No murmur, rubs or gallops noted.  Pulmonary/Chest: Normal effort and positive vesicular breath sounds. No respiratory distress. No wheezes, rales or ronchi noted.  Musculoskeletal: Normal flexion, extension and rotation of the right elbow.  No joint swelling noted.  No pain with palpation of the right elbow.  Strength 5/5 BUE.  Handgrips equal.  Normal flexion, extension, rotation and lateral bending of the lumbar spine.  Able to stand on tiptoes and heels.  No difficulty with gait.  Neurological: Alert and oriented. Coordination normal.    BMET    Component Value Date/Time   NA 140 09/29/2022 1443   K 4.3 09/29/2022 1443   CL 108 09/29/2022 1443   CO2 26 09/29/2022 1443   GLUCOSE 91 09/29/2022 1443   BUN 18 09/29/2022 1443   CREATININE 0.79 09/29/2022 1443   CALCIUM 9.2 09/29/2022 1443   GFRNONAA >60 04/12/2022 2056   GFRAA >60 10/14/2019 1233    Lipid Panel     Component Value Date/Time   CHOL 113 09/29/2022 1443   TRIG 86  09/29/2022 1443   HDL 45 (L) 09/29/2022 1443   CHOLHDL 2.5 09/29/2022 1443   LDLCALC 51 09/29/2022 1443    CBC    Component Value Date/Time   WBC 7.6 09/29/2022 1443   RBC 5.24 (H) 09/29/2022 1443   HGB 9.2 (L) 09/29/2022 1443   HCT 33.9 (L) 09/29/2022 1443   PLT 206 09/29/2022 1443   MCV 64.7 (L) 09/29/2022 1443   MCH 17.6 (L) 09/29/2022 1443   MCHC 27.1 (L) 09/29/2022 1443   RDW 17.3 (H) 09/29/2022 1443   LYMPHSABS 2.6 12/08/2021 1928   MONOABS 0.6 12/08/2021 1928   EOSABS 0.1 12/08/2021 1928   BASOSABS 0.1 12/08/2021 1928    Hgb A1C Lab Results  Component Value Date   HGBA1C 5.4 09/29/2022            Assessment & Plan:  Assessment and Plan    Right Breast Mass New palpable mass in the right breast noticed one week ago. No skin changes or nipple discharge. Last mammogram in 2020. Family history of female cancer. -Order diagnostic bilateral mammogram and ultrasound of the right breast.  Right Elbow Pain New onset of intermittent right elbow pain for the past three days. Pain radiates down the forearm. No redness or numbness. Mild swelling noted. -Advise to use ice and take 400mg  of ibuprofen  twice daily for five days. If no improvement, consider ordering an x-ray.  Numbness in Left Thigh Chronic numbness in the left thigh, exacerbated by standing. Previous lumbar x-ray showed moderate degenerative disc disease and facet hypertrophy at L4, L5, and L5 S1. -Refer to orthopedist for further evaluation and possible MRI.  Weight Management Patient interested in medication to curb appetite. Patient has recently quit smoking. -Attempt to obtain prior authorization for weight management medication. If approved, start at 0.25mg  weekly for four weeks, then increase to 0.5mg  weekly.  Follow-up in one month to discuss chronic conditions.        Schedule a follow-up of chronic conditions Angeline Laura, NP

## 2023-04-19 DIAGNOSIS — M5416 Radiculopathy, lumbar region: Secondary | ICD-10-CM | POA: Diagnosis not present

## 2023-04-19 DIAGNOSIS — M545 Low back pain, unspecified: Secondary | ICD-10-CM | POA: Diagnosis not present

## 2023-04-19 DIAGNOSIS — M170 Bilateral primary osteoarthritis of knee: Secondary | ICD-10-CM | POA: Diagnosis not present

## 2023-04-20 DIAGNOSIS — M5451 Vertebrogenic low back pain: Secondary | ICD-10-CM | POA: Diagnosis not present

## 2023-04-21 DIAGNOSIS — M25561 Pain in right knee: Secondary | ICD-10-CM | POA: Diagnosis not present

## 2023-04-21 DIAGNOSIS — M25562 Pain in left knee: Secondary | ICD-10-CM | POA: Diagnosis not present

## 2023-05-02 ENCOUNTER — Ambulatory Visit
Admission: RE | Admit: 2023-05-02 | Discharge: 2023-05-02 | Disposition: A | Discharge status: Home / Self Care | Payer: 59 | Source: Ambulatory Visit | Attending: Internal Medicine | Admitting: Internal Medicine

## 2023-05-02 DIAGNOSIS — R92313 Mammographic fatty tissue density, bilateral breasts: Secondary | ICD-10-CM | POA: Diagnosis not present

## 2023-05-02 DIAGNOSIS — N644 Mastodynia: Secondary | ICD-10-CM

## 2023-05-02 DIAGNOSIS — N6311 Unspecified lump in the right breast, upper outer quadrant: Secondary | ICD-10-CM | POA: Diagnosis not present

## 2023-05-08 ENCOUNTER — Other Ambulatory Visit: Payer: Self-pay | Admitting: Internal Medicine

## 2023-05-09 NOTE — Telephone Encounter (Signed)
 Requested Prescriptions  Pending Prescriptions Disp Refills   traZODone  (DESYREL ) 50 MG tablet [Pharmacy Med Name: TRAZODONE  50 MG TABLET] 270 tablet 0    Sig: TAKE 3 TABLETS (150 MG TOTAL) BY MOUTH AT BEDTIME AS NEEDED FOR SLEEP.     Psychiatry: Antidepressants - Serotonin Modulator Passed - 05/09/2023  3:51 PM      Passed - Completed PHQ-2 or PHQ-9 in the last 360 days      Passed - Valid encounter within last 6 months    Recent Outpatient Visits           3 weeks ago Breast pain   [facility] [place], [place] W, NP   1 month ago Rash and nonspecific skin eruption   [facility] [place], Angeline ORN, NP   2 months ago Restless leg syndrome   [facility] [facility] [place], Angeline ORN, NP   4 months ago Restless leg syndrome   [facility] [place], Angeline ORN, NP   5 months ago Primary insomnia   [facility] [place], Angeline ORN, NP       Future Appointments             In 3 days Baity, Angeline ORN, NP  [facility], [place], [facility]

## 2023-05-12 ENCOUNTER — Ambulatory Visit: Payer: Self-pay | Admitting: Internal Medicine

## 2023-05-12 ENCOUNTER — Ambulatory Visit: Payer: 59 | Admitting: Internal Medicine

## 2023-05-16 ENCOUNTER — Other Ambulatory Visit: Payer: Self-pay | Admitting: Internal Medicine

## 2023-05-16 ENCOUNTER — Ambulatory Visit: Payer: 59 | Admitting: Internal Medicine

## 2023-05-16 NOTE — Progress Notes (Deleted)
 HPI  Pt presents to the clinic today for follow-up of chronic conditions.  Anxiety and depression: Managed with hydroxyzine as needed.  She does not see a therapist.  She denies SI/HI.  Migraines: These occur about 1 x month. She is not sure what triggers this. She takes ibuprofen as needed with some relief of symptoms. She lays down in a dark room and the headache finally resolves.  Iron deficiency anemia: Her last H/H was 9.2/33.9, 09/2022.  She is not taking any iron at this time.  She is status post gastric bypass.  OA: Mainly in her low back , left hip , and bilateral knees. Her left is worse than the right.  She is taking meloxicam and gabapentin as prescribed.  She does not follow with orthopedics.  GERD: Triggered by.  She denies breakthrough on omeprazole.  There is no upper GI on file.  Insomnia: She has difficulty.  She is taking hydroxyzine as needed with some relief of symptoms.  There is no sleep study on file.  RLS: Managed with gabapentin and ropinirole.  She does not follow with neurology.  Past Medical History:  Diagnosis Date   Migraines     Current Outpatient Medications  Medication Sig Dispense Refill   gabapentin (NEURONTIN) 300 MG capsule Take 300 mg by mouth.     hydrOXYzine (VISTARIL) 25 MG capsule TAKE 1 CAPSULE (25 MG TOTAL) BY MOUTH AT BEDTIME AS NEEDED. 90 capsule 1   meloxicam (MOBIC) 7.5 MG tablet Take 1 tablet (7.5 mg total) by mouth daily. 90 tablet 1   omeprazole (PRILOSEC) 20 MG capsule Take 1 capsule (20 mg total) by mouth daily. (Patient not taking: Reported on 04/18/2023) 90 capsule 1   permethrin (ELIMITE) 5 % cream Apply cream whole body, from head to toe at bedtime; leave on for 8 to 12 hours (overnight), wash off next day. Repeat in 14 days if needed. (Patient not taking: Reported on 04/18/2023) 60 g 1   predniSONE (DELTASONE) 10 MG tablet Take 6 tabs on day 1, 5 tabs on day 2, 4 tabs on day 3, 3 tabs on day 4, 2 tabs on day 5, 1 tab on day 6  (Patient not taking: Reported on 04/18/2023) 21 tablet 0   rOPINIRole (REQUIP) 1 MG tablet Take 1 tablet (1 mg total) by mouth 2 (two) times daily. 180 tablet 1   Semaglutide-Weight Management 0.25 MG/0.5ML SOAJ Inject 0.25 mg into the skin once a week. 2 mL 0   traZODone (DESYREL) 50 MG tablet TAKE 3 TABLETS (150 MG TOTAL) BY MOUTH AT BEDTIME AS NEEDED FOR SLEEP. 270 tablet 0   No current facility-administered medications for this visit.    No Known Allergies  Family History  Problem Relation Age of Onset   Anxiety disorder Mother    Depression Mother    Heart attack Father 19   Alcohol abuse Father    Alcohol abuse Sister    Lung cancer Maternal Grandmother    Breast cancer Neg Hx     Social History   Socioeconomic History   Marital status: Married    Spouse name: Not on file   Number of children: Not on file   Years of education: Not on file   Highest education level: Not on file  Occupational History   Not on file  Tobacco Use   Smoking status: Every Day    Average packs/day: 1 pack/day for 18.0 years (18.0 ttl pk-yrs)    Types: Cigarettes  Start date: 02/12/2005   Smokeless tobacco: Former  Building services engineer status: Never Used  Substance and Sexual Activity   Alcohol use: Yes    Comment: social   Drug use: Not Currently   Sexual activity: Not on file  Other Topics Concern   Not on file  Social History Narrative   Not on file   Social Drivers of Health   Financial Resource Strain: Medium Risk (03/06/2023)   Overall Financial Resource Strain (CARDIA)    Difficulty of Paying Living Expenses: Somewhat hard  Food Insecurity: Food Insecurity Present (03/06/2023)   Hunger Vital Sign    Worried About Running Out of Food in the Last Year: Never true    Ran Out of Food in the Last Year: Sometimes true  Transportation Needs: No Transportation Needs (03/06/2023)   PRAPARE - Administrator, Civil Service (Medical): No    Lack of Transportation  (Non-Medical): No  Physical Activity: Inactive (03/06/2023)   Exercise Vital Sign    Days of Exercise per Week: 0 days    Minutes of Exercise per Session: 0 min  Stress: No Stress Concern Present (03/06/2023)   [employer] of Occupational Health - Occupational Stress Questionnaire    Feeling of Stress : Not at all  Social Connections: Moderately Isolated (03/06/2023)   Social Connection and Isolation Panel [NHANES]    Frequency of Communication with Friends and Family: More than three times a week    Frequency of Social Gatherings with Friends and Family: Once a week    Attends Religious Services: Never    Database administrator or Organizations: No    Attends Banker Meetings: Never    Marital Status: Married  Catering manager Violence: Not At Risk (03/06/2023)   Humiliation, Afraid, Rape, and Kick questionnaire    Fear of Current or Ex-Partner: No    Emotionally Abused: No    Physically Abused: No    Sexually Abused: No    ROS:  Constitutional: Patient reports intermittent headaches.  Denies fever, malaise, fatigue, or abrupt weight changes.  HEENT: Denies eye pain, eye redness, ear pain, ringing in the ears, wax buildup, runny nose, nasal congestion, bloody nose, or sore throat. Respiratory: Denies difficulty breathing, shortness of breath, cough or sputum production.   Cardiovascular: Denies chest pain, chest tightness, palpitations or swelling in the hands or feet.  Gastrointestinal: Denies abdominal pain, bloating, constipation, diarrhea or blood in the stool.  GU: Denies frequency, urgency, pain with urination, blood in urine, odor or discharge. Musculoskeletal: Patient reports chronic low back pain, left hip pain and bilateral knee pain.  Denies decrease in range of motion, difficulty with gait, muscle pain or joint swelling.  Skin: Denies redness, rashes, lesions or ulcercations.  Neurological: Patient reports instant.  Denies dizziness, difficulty with  memory, difficulty with speech or problems with balance and coordination.  Psych: Patient has a history of anxiety and depression.  Denies SI/HI.  No other specific complaints in a complete review of systems (except as listed in HPI above).  PE:  LMP 01/14/2019 (Exact Date)  Wt Readings from Last 3 Encounters:  04/18/23 252 lb (114.3 kg)  03/06/23 252 lb 6.4 oz (114.5 kg)  01/09/23 253 lb (114.8 kg)    General: Appears her stated age, obese in NAD. HEENT: Head: normal shape and size; Eyes: sclera white, no icterus, conjunctiva pink, PERRLA and EOMs intact;  Neck: Neck supple, trachea midline. No masses, lumps or thyromegaly present.  Cardiovascular: Normal rate and rhythm. S1,S2 noted.  No murmur, rubs or gallops noted. No JVD or BLE edema. No carotid bruits noted. Pulmonary/Chest: Normal effort and positive vesicular breath sounds. No respiratory distress. No wheezes, rales or ronchi noted.  Musculoskeletal: Normal flexion, extension, rotation and lateral bending of the spine.  No bony tenderness noted over the spine.  Normal flexion, extension, abduction, abduction, internal and external rotation of the left hip.  No pain with palpation of the left hip.  Normal flexion and extension of bilateral knees.  Crepitus noted with range of motion of the knees.  Pain with palpation along bilateral medial joint lines.  Strength 5/5 BLE.  Gait slow and steady without device. Neurological: Alert and oriented. Coordination normal.  Psychiatric: Mood and affect normal. Behavior is normal. Judgment and thought content normal.    BMET    Component Value Date/Time   NA 140 09/29/2022 1443   K 4.3 09/29/2022 1443   CL 108 09/29/2022 1443   CO2 26 09/29/2022 1443   GLUCOSE 91 09/29/2022 1443   BUN 18 09/29/2022 1443   CREATININE 0.79 09/29/2022 1443   CALCIUM 9.2 09/29/2022 1443   GFRNONAA >60 04/12/2022 2056   GFRAA >60 10/14/2019 1233    Lipid Panel     Component Value Date/Time   CHOL 113  09/29/2022 1443   TRIG 86 09/29/2022 1443   HDL 45 (L) 09/29/2022 1443   CHOLHDL 2.5 09/29/2022 1443   LDLCALC 51 09/29/2022 1443    CBC    Component Value Date/Time   WBC 7.6 09/29/2022 1443   RBC 5.24 (H) 09/29/2022 1443   HGB 9.2 (L) 09/29/2022 1443   HCT 33.9 (L) 09/29/2022 1443   PLT 206 09/29/2022 1443   MCV 64.7 (L) 09/29/2022 1443   MCH 17.6 (L) 09/29/2022 1443   MCHC 27.1 (L) 09/29/2022 1443   RDW 17.3 (H) 09/29/2022 1443   LYMPHSABS 2.6 12/08/2021 1928   MONOABS 0.6 12/08/2021 1928   EOSABS 0.1 12/08/2021 1928   BASOSABS 0.1 12/08/2021 1928    Hgb A1C Lab Results  Component Value Date   HGBA1C 5.4 09/29/2022     Assessment and Plan:    RTC in 6 months for your annual exam Nicki Reaper, NP

## 2023-05-16 NOTE — Telephone Encounter (Signed)
Requested Prescriptions  Pending Prescriptions Disp Refills   hydrOXYzine (VISTARIL) 25 MG capsule [Pharmacy Med Name: HYDROXYZINE PAM 25 MG CAP] 30 capsule 5    Sig: TAKE 1 CAPSULE (25 MG TOTAL) BY MOUTH AT BEDTIME AS NEEDED.     Ear, Nose, and Throat:  Antihistamines 2 Passed - 05/16/2023  4:06 PM      Passed - Cr in normal range and within 360 days    Creat  Date Value Ref Range Status  09/29/2022 0.79 0.50 - 1.03 mg/dL Final         Passed - Valid encounter within last 12 months    Recent Outpatient Visits           4 weeks ago Breast pain   [place] [facility] [place], [place] W, NP   1 month ago Rash and nonspecific skin eruption   [place] [facility] [place], Salvadore Oxford, NP   2 months ago Restless leg syndrome   [place] [facility] [place], Salvadore Oxford, NP   4 months ago Restless leg syndrome   [place] [facility] [place], Salvadore Oxford, NP   5 months ago Primary insomnia   [place] [facility] [place], Salvadore Oxford, NP       Future Appointments             In 6 days [place], Salvadore Oxford, NP [facility], [facility]

## 2023-05-22 ENCOUNTER — Encounter: Payer: Self-pay | Admitting: Internal Medicine

## 2023-05-22 ENCOUNTER — Ambulatory Visit: Payer: 59 | Admitting: Internal Medicine

## 2023-05-22 VITALS — BP 112/68 | Ht 62.5 in | Wt 248.4 lb

## 2023-05-22 DIAGNOSIS — E66811 Obesity, class 1: Secondary | ICD-10-CM

## 2023-05-22 DIAGNOSIS — F32A Depression, unspecified: Secondary | ICD-10-CM | POA: Diagnosis not present

## 2023-05-22 DIAGNOSIS — G2581 Restless legs syndrome: Secondary | ICD-10-CM | POA: Diagnosis not present

## 2023-05-22 DIAGNOSIS — E6609 Other obesity due to excess calories: Secondary | ICD-10-CM | POA: Diagnosis not present

## 2023-05-22 DIAGNOSIS — K219 Gastro-esophageal reflux disease without esophagitis: Secondary | ICD-10-CM | POA: Diagnosis not present

## 2023-05-22 DIAGNOSIS — M5442 Lumbago with sciatica, left side: Secondary | ICD-10-CM | POA: Diagnosis not present

## 2023-05-22 DIAGNOSIS — F419 Anxiety disorder, unspecified: Secondary | ICD-10-CM

## 2023-05-22 DIAGNOSIS — R21 Rash and other nonspecific skin eruption: Secondary | ICD-10-CM | POA: Diagnosis not present

## 2023-05-22 DIAGNOSIS — M25561 Pain in right knee: Secondary | ICD-10-CM | POA: Diagnosis not present

## 2023-05-22 DIAGNOSIS — G43019 Migraine without aura, intractable, without status migrainosus: Secondary | ICD-10-CM

## 2023-05-22 DIAGNOSIS — D508 Other iron deficiency anemias: Secondary | ICD-10-CM

## 2023-05-22 DIAGNOSIS — M25552 Pain in left hip: Secondary | ICD-10-CM | POA: Diagnosis not present

## 2023-05-22 DIAGNOSIS — G8929 Other chronic pain: Secondary | ICD-10-CM

## 2023-05-22 MED ORDER — SEMAGLUTIDE-WEIGHT MANAGEMENT 0.5 MG/0.5ML ~~LOC~~ SOAJ: 0.5000 mg | SUBCUTANEOUS | 0 refills | Status: DC

## 2023-05-22 NOTE — Assessment & Plan Note (Signed)
Encourage exercise for weight loss and core strengthening as this can help reduce back pain Continue meloxicam and gabapentin

## 2023-05-22 NOTE — Assessment & Plan Note (Signed)
Continue meloxicam and gabapentin Encourage weight loss as this can reduce joint pain

## 2023-05-22 NOTE — Assessment & Plan Note (Signed)
Currently not an issue Okay to take Tums OTC as needed Encourage weight loss as this can help reduce reflux symptoms

## 2023-05-22 NOTE — Assessment & Plan Note (Signed)
Not medicated Support offered 

## 2023-05-22 NOTE — Assessment & Plan Note (Signed)
Continue meloxicam and gabapentin Encourage weight loss as this can help reduce joint pain

## 2023-05-22 NOTE — Assessment & Plan Note (Signed)
She will try oral iron 325 mg daily Consider referral to hematology as she is status post gastric bypass and will not absorb oral iron

## 2023-05-22 NOTE — Assessment & Plan Note (Signed)
Try to identify and avoid triggers Continue ibuprofen OTC as needed

## 2023-05-22 NOTE — Assessment & Plan Note (Signed)
 Continue gabapentin.

## 2023-05-22 NOTE — Progress Notes (Signed)
HPI  Pt presents to the clinic today for follow-up of chronic conditions  Anxiety and depression: Persistent.  She is not currently taking any medications for this.  She does not see a therapist.  She denies SI/HI.  Migraines: These occur about 1 x month. She is not sure what triggers this. She takes ibuprofen as needed with some relief of symptoms. She lays down in a dark room and the headache finally resolves.  She does not follow with neurology.  Iron deficiency anemia: Her last H/H was 9.2/33.9, 09/2022.  She is not taking any iron at this time.  She is status post gastric bypass.  OA: Mainly in her low back pain, left hip pain, and bilateral knee pain. She is taking gabapentin and meloxicam as prescribed.  She does not follow with orthopedics at this time but she has in the past.  GERD: Currently not an issue. She no longer takes omeprazole.  There is no upper GI on file.  RLS: Managed with gabapentin but she longer takes ropinirole.  She does not follow with neurology.  She also reports persistent rash.  She reports the rash comes and goes.  It is typically located on her upper chest, abdomen and back.  The rash itches.  She has failed treatment with prednisone, antihistamines and permethrin in the past.  She has not seen dermatology.  Past Medical History:  Diagnosis Date   Migraines     Current Outpatient Medications  Medication Sig Dispense Refill   gabapentin (NEURONTIN) 300 MG capsule Take 300 mg by mouth.     hydrOXYzine (VISTARIL) 25 MG capsule TAKE 1 CAPSULE (25 MG TOTAL) BY MOUTH AT BEDTIME AS NEEDED. 30 capsule 5   meloxicam (MOBIC) 7.5 MG tablet Take 1 tablet (7.5 mg total) by mouth daily. 90 tablet 1   omeprazole (PRILOSEC) 20 MG capsule Take 1 capsule (20 mg total) by mouth daily. (Patient not taking: Reported on 04/18/2023) 90 capsule 1   permethrin (ELIMITE) 5 % cream Apply cream whole body, from head to toe at bedtime; leave on for 8 to 12 hours (overnight), wash off  next day. Repeat in 14 days if needed. (Patient not taking: Reported on 04/18/2023) 60 g 1   predniSONE (DELTASONE) 10 MG tablet Take 6 tabs on day 1, 5 tabs on day 2, 4 tabs on day 3, 3 tabs on day 4, 2 tabs on day 5, 1 tab on day 6 (Patient not taking: Reported on 04/18/2023) 21 tablet 0   rOPINIRole (REQUIP) 1 MG tablet Take 1 tablet (1 mg total) by mouth 2 (two) times daily. 180 tablet 1   Semaglutide-Weight Management 0.25 MG/0.5ML SOAJ Inject 0.25 mg into the skin once a week. 2 mL 0   traZODone (DESYREL) 50 MG tablet TAKE 3 TABLETS (150 MG TOTAL) BY MOUTH AT BEDTIME AS NEEDED FOR SLEEP. 270 tablet 0   No current facility-administered medications for this visit.    No Known Allergies  Family History  Problem Relation Age of Onset   Anxiety disorder Mother    Depression Mother    Heart attack Father 48   Alcohol abuse Father    Alcohol abuse Sister    Lung cancer Maternal Grandmother    Breast cancer Neg Hx     Social History   Socioeconomic History   Marital status: Married    Spouse name: Not on file   Number of children: Not on file   Years of education: Not on file  Highest education level: Not on file  Occupational History   Not on file  Tobacco Use   Smoking status: Every Day    Average packs/day: 1 pack/day for 18.0 years (18.0 ttl pk-yrs)    Types: Cigarettes    Start date: 02/12/2005   Smokeless tobacco: Former  Building services engineer status: Never Used  Substance and Sexual Activity   Alcohol use: Yes    Comment: social   Drug use: Not Currently   Sexual activity: Not on file  Other Topics Concern   Not on file  Social History Narrative   Not on file   Social Drivers of Health   Financial Resource Strain: Medium Risk (03/06/2023)   Overall Financial Resource Strain (CARDIA)    Difficulty of Paying Living Expenses: Somewhat hard  Food Insecurity: Food Insecurity Present (03/06/2023)   Hunger Vital Sign    Worried About Running Out of Food in the Last  Year: Never true    Ran Out of Food in the Last Year: Sometimes true  Transportation Needs: No Transportation Needs (03/06/2023)   PRAPARE - Administrator, Civil Service (Medical): No    Lack of Transportation (Non-Medical): No  Physical Activity: Inactive (03/06/2023)   Exercise Vital Sign    Days of Exercise per Week: 0 days    Minutes of Exercise per Session: 0 min  Stress: No Stress Concern Present (03/06/2023)   [employer] of Occupational Health - Occupational Stress Questionnaire    Feeling of Stress : Not at all  Social Connections: Moderately Isolated (03/06/2023)   Social Connection and Isolation Panel [NHANES]    Frequency of Communication with Friends and Family: More than three times a week    Frequency of Social Gatherings with Friends and Family: Once a week    Attends Religious Services: Never    Database administrator or Organizations: No    Attends Banker Meetings: Never    Marital Status: Married  Catering manager Violence: Not At Risk (03/06/2023)   Humiliation, Afraid, Rape, and Kick questionnaire    Fear of Current or Ex-Partner: No    Emotionally Abused: No    Physically Abused: No    Sexually Abused: No    ROS:  Constitutional: Patient reports intermittent headaches.  Denies fever, malaise, fatigue, or abrupt weight changes.  HEENT: Denies eye pain, eye redness, ear pain, ringing in the ears, wax buildup, runny nose, nasal congestion, bloody nose, or sore throat. Respiratory: Denies difficulty breathing, shortness of breath, cough or sputum production.   Cardiovascular: Denies chest pain, chest tightness, palpitations or swelling in the hands or feet.  Gastrointestinal: Denies abdominal pain, bloating, constipation, diarrhea or blood in the stool.  GU: Denies frequency, urgency, pain with urination, blood in urine, odor or discharge. Musculoskeletal: Patient reports chronic low back pain, left hip pain and bilateral knee pain.   Denies decrease in range of motion, difficulty with gait, muscle pain or joint swelling.  Skin: Patient reports intermittent rashes.  Denies redness, lesions or ulcercations.  Neurological: Patient reports restless legs.  Denies dizziness, difficulty with memory, difficulty with speech or problems with balance and coordination.  Psych: Patient has a history of anxiety and depression.  Denies SI/HI.  No other specific complaints in a complete review of systems (except as listed in HPI above).  PE: BP 112/68 (BP Location: Left Arm, Patient Position: Sitting, Cuff Size: Large)   Ht 5' 2.5" (1.588 m)   Wt 248 lb  6.4 oz (112.7 kg)   LMP 01/14/2019 (Exact Date)   BMI 44.71 kg/m   Wt Readings from Last 3 Encounters:  04/18/23 252 lb (114.3 kg)  03/06/23 252 lb 6.4 oz (114.5 kg)  01/09/23 253 lb (114.8 kg)    General: Appears her stated age, obese in NAD. Skin: HEENT: Head: normal shape and size; Eyes: sclera white, no icterus, conjunctiva pink, PERRLA and EOMs intact;  Cardiovascular: Normal rate and rhythm. S1,S2 noted.  No murmur, rubs or gallops noted. No JVD or BLE edema. No carotid bruits noted. Pulmonary/Chest: Normal effort and positive vesicular breath sounds. No respiratory distress. No wheezes, rales or ronchi noted.  Musculoskeletal: Gait slow and steady without device. Neurological: Alert and oriented. Coordination normal.  Psychiatric: Mood and affect normal. Behavior is normal. Judgment and thought content normal.    BMET    Component Value Date/Time   NA 140 09/29/2022 1443   K 4.3 09/29/2022 1443   CL 108 09/29/2022 1443   CO2 26 09/29/2022 1443   GLUCOSE 91 09/29/2022 1443   BUN 18 09/29/2022 1443   CREATININE 0.79 09/29/2022 1443   CALCIUM 9.2 09/29/2022 1443   GFRNONAA >60 04/12/2022 2056   GFRAA >60 10/14/2019 1233    Lipid Panel     Component Value Date/Time   CHOL 113 09/29/2022 1443   TRIG 86 09/29/2022 1443   HDL 45 (L) 09/29/2022 1443   CHOLHDL  2.5 09/29/2022 1443   LDLCALC 51 09/29/2022 1443    CBC    Component Value Date/Time   WBC 7.6 09/29/2022 1443   RBC 5.24 (H) 09/29/2022 1443   HGB 9.2 (L) 09/29/2022 1443   HCT 33.9 (L) 09/29/2022 1443   PLT 206 09/29/2022 1443   MCV 64.7 (L) 09/29/2022 1443   MCH 17.6 (L) 09/29/2022 1443   MCHC 27.1 (L) 09/29/2022 1443   RDW 17.3 (H) 09/29/2022 1443   LYMPHSABS 2.6 12/08/2021 1928   MONOABS 0.6 12/08/2021 1928   EOSABS 0.1 12/08/2021 1928   BASOSABS 0.1 12/08/2021 1928    Hgb A1C Lab Results  Component Value Date   HGBA1C 5.4 09/29/2022     Assessment and Plan:  Rash and nonspecific skin eruption:  Advised her to try antihistamine daily such as Zyrtec 10 mg to help with itching Referral to dermatology for further evaluation and treatment  RTC in 6 months for your annual exam Nicki Reaper, NP

## 2023-05-22 NOTE — Assessment & Plan Note (Signed)
Will increase semaglutide to 0.5 mg weekly Encourage high-protein, low-carb diet and exercise for weight loss

## 2023-05-22 NOTE — Patient Instructions (Signed)
 Rash, Adult A rash is a breakout of spots or blotches on the skin. It can affect the way the skin looks and feels. Many things can cause a rash. Common causes include: Viral infections. These include colds, measles, and hand, foot, and mouth disease. Bacterial infections. These include scarlet fever and impetigo. Fungal infections. These include athlete's foot, ringworm, and yeast rashes. Skin irritation. This may be from heat rash, exposure to moisture or friction for a long time (intertrigo), or exposure to soap or skin care products (eczema). Allergic reactions. These may be caused by foods, medicines, or things like poison ivy. Some rashes may go away after a few days. Others may last for a few weeks. The goal of treatment is to stop the itching and keep the rash from spreading. Follow these instructions at home: Medicine Take or apply over-the-counter and prescription medicines only as told by your health care provider. These may include: Corticosteroids. These can help treat red or swollen skin. They may be given as creams or as medicines to take by mouth (orally). Anti-itch lotions. Allergy medicines. Pain medicine. Antifungal medicine if the rash is from a fungal infection. Antibiotics if you have an infection.  Skin care Apply cool, wet cloths (compresses) to the affected areas. Do not scratch or rub your skin. Avoid covering the rash. Keep it exposed to air as often as you can. Managing itching and discomfort Avoid hot showers and baths. These can make itching worse. A cold shower may help. Try taking a bath with: Epsom salts. You can get these at your local pharmacy or grocery store. Follow the instructions on the package. Baking soda. Pour a small amount into the bath as told by your provider. Colloidal oatmeal. You can get this at your local pharmacy or grocery store. Follow the instructions on the package. Try putting baking soda paste on your skin. Stir water into baking  soda until it becomes like a paste. Try using calamine lotion or cortisone cream to help with itchiness. Keep cool. Stay out of the sun. Sweating and being hot can make itching worse. General instructions  Rest as needed. Drink enough fluid to keep your pee (urine) pale yellow. Wear loose-fitting clothes. Avoid scented soaps, detergents, and perfumes. Use gentle soaps, detergents, perfumes, and cosmetics. Avoid the things that cause your rash (triggers). Keep a journal to help keep track of your triggers. Write down: What you eat. What cosmetics you use. What you drink. What you wear. This includes jewelry. Contact a health care provider if: You sweat at night more than normal. You pee (urinate) more or less than normal, or your pee is a darker color than normal. Your eyes become sensitive to light. Your skin or the white parts of your eyes turn yellow (jaundice). Your skin tingles or is numb. You get painful blisters in your nose or mouth. Your rash does not go away after a few days, or it gets worse. You are more tired or thirsty than normal. You have new or worse symptoms. These may include: Pain in your abdomen. Fever. Diarrhea or vomiting. Weakness or weight loss. Get help right away if: You get confused. You have a severe headache, a stiff neck, or severe joint pain or stiffness. You become very sleepy or not responsive. You have a seizure. This information is not intended to replace advice given to you by your health care provider. Make sure you discuss any questions you have with your health care provider. Document Revised: 01/07/2022 Document Reviewed:  01/07/2022 Elsevier Patient Education  2024 [employer].

## 2023-06-14 ENCOUNTER — Other Ambulatory Visit: Payer: Self-pay | Admitting: Internal Medicine

## 2023-06-14 NOTE — Telephone Encounter (Signed)
 Requested Prescriptions  Pending Prescriptions Disp Refills   hydrOXYzine (VISTARIL) 25 MG capsule [Pharmacy Med Name: HYDROXYZINE PAM 25 MG CAP] 90 capsule 0    Sig: TAKE 1 CAPSULE (25 MG TOTAL) BY MOUTH AT BEDTIME AS NEEDED.     Ear, Nose, and Throat:  Antihistamines 2 Passed - 06/14/2023  3:58 PM      Passed - Cr in normal range and within 360 days    Creat  Date Value Ref Range Status  09/29/2022 0.79 0.50 - 1.03 mg/dL Final         Passed - Valid encounter within last 12 months    Recent Outpatient Visits           1 month ago Breast pain   [place] [facility] [place], [place] W, NP   2 months ago Rash and nonspecific skin eruption   [place] [facility] [place], Salvadore Oxford, NP   3 months ago Restless leg syndrome   [place] [facility] [place], Salvadore Oxford, NP   5 months ago Restless leg syndrome   [place] [facility] [employer], Salvadore Oxford, NP   6 months ago Primary insomnia   [place] [facility] [place], Salvadore Oxford, NP       Future Appointments             In 5 months Baity, Salvadore Oxford, NP [place] [facility], [facility]

## 2023-06-19 ENCOUNTER — Other Ambulatory Visit: Payer: Self-pay | Admitting: Internal Medicine

## 2023-06-19 MED ORDER — SEMAGLUTIDE-WEIGHT MANAGEMENT 1 MG/0.5ML ~~LOC~~ SOAJ: 1.0000 mg | SUBCUTANEOUS | 0 refills | Status: DC

## 2023-08-14 ENCOUNTER — Other Ambulatory Visit: Payer: Self-pay | Admitting: Internal Medicine

## 2023-08-16 NOTE — Telephone Encounter (Signed)
 Requested medication (s) are due for refill today: yes  Requested medication (s) are on the active medication list: yes  Last refill:  04/18/23  Future visit scheduled: yes  Notes to clinic:  Unable to refill per protocol, last refill by another/historical provider.      Requested Prescriptions  Pending Prescriptions Disp Refills   gabapentin  (NEURONTIN ) 300 MG capsule [Pharmacy Med Name: GABAPENTIN  300 MG CAPSULE] 180 capsule 1    Sig: TAKE 1 CAPSULE BY MOUTH TWICE A DAY     Neurology: Anticonvulsants - gabapentin  Passed - 08/16/2023 11:59 AM      Passed - Cr in normal range and within 360 days    Creat  Date Value Ref Range Status  09/29/2022 0.79 0.50 - 1.03 mg/dL Final         Passed - Completed PHQ-2 or PHQ-9 in the last 360 days      Passed - Valid encounter within last 12 months    Recent Outpatient Visits           2 months ago Rash and nonspecific skin eruption   [place] [facility] [place], Rankin Buzzard, NP       Future Appointments             In 3 months Baity, Rankin Buzzard, NP [place] [facility], [facility]

## 2023-08-18 ENCOUNTER — Other Ambulatory Visit: Payer: Self-pay | Admitting: Internal Medicine

## 2023-08-21 NOTE — Telephone Encounter (Signed)
 Requested Prescriptions  Pending Prescriptions Disp Refills   traZODone  (DESYREL ) 50 MG tablet [Pharmacy Med Name: TRAZODONE  50 MG TABLET] 270 tablet 0    Sig: TAKE 3 TABLETS (150 MG TOTAL) BY MOUTH AT BEDTIME AS NEEDED FOR SLEEP.     Psychiatry: Antidepressants - Serotonin Modulator Passed - 08/21/2023 12:57 PM      Passed - Completed PHQ-2 or PHQ-9 in the last 360 days      Passed - Valid encounter within last 6 months    Recent Outpatient Visits           3 months ago Rash and nonspecific skin eruption   [place] [facility] [place], Rankin Buzzard, NP       Future Appointments             In 3 months Baity, Rankin Buzzard, NP [place] [facility], [facility]

## 2023-09-12 ENCOUNTER — Ambulatory Visit: Admitting: Internal Medicine

## 2023-09-21 DIAGNOSIS — M170 Bilateral primary osteoarthritis of knee: Secondary | ICD-10-CM | POA: Diagnosis not present

## 2023-09-21 DIAGNOSIS — M25561 Pain in right knee: Secondary | ICD-10-CM | POA: Diagnosis not present

## 2023-09-21 DIAGNOSIS — M25562 Pain in left knee: Secondary | ICD-10-CM | POA: Diagnosis not present

## 2023-09-21 DIAGNOSIS — G8929 Other chronic pain: Secondary | ICD-10-CM | POA: Diagnosis not present

## 2023-09-22 ENCOUNTER — Telehealth: Payer: Self-pay

## 2023-09-22 DIAGNOSIS — D508 Other iron deficiency anemias: Secondary | ICD-10-CM

## 2023-09-22 NOTE — Progress Notes (Signed)
 Complex Care Management Note  Care Guide Note 09/22/2023 Name: JERE VANBUREN MRN: [id number] DOB: 1970-05-10  KRISTYNE WOODRING is a 54 y.o. year old female who sees Baity, Rankin Buzzard, NP for primary care. I reached out to Willian Harrow by phone today to offer complex care management services.  Ms. Surges was given information about Complex Care Management services today including:   The Complex Care Management services include support from the care team which includes your Nurse Care Manager, Clinical Social Worker, or Pharmacist.  The Complex Care Management team is here to help remove barriers to the health concerns and goals most important to you. Complex Care Management services are voluntary, and the patient may decline or stop services at any time by request to their care team member.   Complex Care Management Consent Status: Patient agreed to services and verbal consent obtained.   Follow up plan:  Telephone appointment with complex care management team member scheduled for:  09/25/23 at 2:00 p.m.   Encounter Outcome:  Patient Scheduled  [facility]  [facility], [facility] Care Management Assistant Direct Dial: [phone]  Fax: [phone]

## 2023-09-24 ENCOUNTER — Other Ambulatory Visit: Payer: Self-pay | Admitting: Internal Medicine

## 2023-09-25 ENCOUNTER — Telehealth: Payer: Self-pay

## 2023-09-26 NOTE — Telephone Encounter (Signed)
 Requested by interface surescripts. Future visit in 1 month.  Requested Prescriptions  Pending Prescriptions Disp Refills   hydrOXYzine  (VISTARIL ) 25 MG capsule [Pharmacy Med Name: HYDROXYZINE  PAM 25 MG CAP] 30 capsule 2    Sig: TAKE 1 CAPSULE (25 MG TOTAL) BY MOUTH AT BEDTIME AS NEEDED.     Ear, Nose, and Throat:  Antihistamines 2 Failed - 09/26/2023  2:08 PM      Failed - Cr in normal range and within 360 days    Creat  Date Value Ref Range Status  09/29/2022 0.79 0.50 - 1.03 mg/dL Final         Passed - Valid encounter within last 12 months    Recent Outpatient Visits           4 months ago Rash and nonspecific skin eruption   [place] [facility] [place], Angeline ORN, NP       Future Appointments             In 1 month [place], Angeline ORN, NP [place] [facility], [place] [facility]

## 2023-10-15 DIAGNOSIS — U071 COVID-19: Secondary | ICD-10-CM | POA: Diagnosis not present

## 2023-10-15 DIAGNOSIS — Z20822 Contact with and (suspected) exposure to covid-19: Secondary | ICD-10-CM | POA: Diagnosis not present

## 2023-10-20 ENCOUNTER — Other Ambulatory Visit: Payer: Self-pay

## 2023-10-20 NOTE — Patient Outreach (Signed)
 Complex Care Management   Visit Note  10/20/2023  Name:  Terri Conner MRN: [id number] DOB: 26-Aug-1970  Situation: Referral received for Complex Care Management related to Migraines I obtained verbal consent from Patient.  Visit completed with Ms. Pates via telephone.  Background:   Past Medical History:  Diagnosis Date   Migraines     Assessment: Patient Reported Symptoms: Cognitive Cognitive Status: Alert and oriented to person, place, and time, Normal speech and language skills Cognitive/Intellectual Conditions Management [RPT]: None reported or documented in medical history or problem list Health Maintenance Behaviors: Annual physical exam, Stress management Healing Pattern: Average Health Facilitated by: Stress management, Rest  Neurological Neurological Review of Symptoms: No symptoms reported Neurological Management Strategies: Medication therapy, Routine screening, Coping strategies Neurological Self-Management Outcome: 4 (good) Neurological Comment: Reports symptoms related to restless leg are currently managed with gabapentin   HEENT HEENT Symptoms Reported: No symptoms reported HEENT Management Strategies: Routine screening HEENT Self-Management Outcome: 4 (good)  Cardiovascular Cardiovascular Symptoms Reported: No symptoms reported Does patient have uncontrolled Hypertension?: No Cardiovascular Management Strategies: Routine screening Cardiovascular Self-Management Outcome: 4 (good)  Respiratory Respiratory Symptoms Reported: No symptoms reported Respiratory Management Strategies: Routine screening Respiratory Self-Management Outcome: 4 (good)  Endocrine Endocrine Symptoms Reported: No symptoms reported Is patient diabetic?: No Endocrine Self-Management Outcome: 4 (good)  Gastrointestinal Gastrointestinal Symptoms Reported: No symptoms reported Gastrointestinal Management Strategies:  (Routine screening) Gastrointestinal Self-Management Outcome: 4 (good)   Genitourinary Genitourinary Symptoms Reported: No symptoms reported Genitourinary Management Strategies:  (Routine screening) Genitourinary Self-Management Outcome: 4 (good)  Integumentary Integumentary Symptoms Reported: Itching Additional Integumentary Details: Reports generalized itching which she attributes to possible eczema. Reports currently using topical lotions and creams. Pending outreach with Dermatologist in October. Reviewed worsening symptoms and indications for seeking immediate medical attention. Skin Management Strategies: Routine screening, Coping strategies (topical creams and oinments) Skin Self-Management Outcome: 4 (good)  Musculoskeletal Musculoskelatal Symptoms Reviewed: No symptoms reported Musculoskeletal Management Strategies: Routine screening Musculoskeletal Self-Management Outcome: 4 (good) Falls in the past year?: No Number of falls in past year: 1 or less Was there an injury with Fall?: No Fall Risk Category Calculator: 0 Patient Fall Risk Level: Low Fall Risk Patient at Risk for Falls Due to: Medication side effect Fall risk Follow up: Falls prevention discussed  Psychosocial Psychosocial Symptoms Reported: Anxiety - if selected complete GAD Additional Psychological Details: Reports episodes of anxiety due to pending divorce. Reports symptoms are currently managed with prescription xanax. Declines current need for outreach/counseling. Behavioral Management Strategies: Coping strategies, Medication therapy Behavioral Health Self-Management Outcome: 4 (good) Major Change/Loss/Stressor/Fears (CP): Legal concerns (Reports pending divorce) Behaviors When Feeling Stressed/Fearful: Reports resting and reflection Techniques to Cope with Loss/Stress/Change: Diversional activities Quality of Family Relationships: supportive (Reports support from [place]) Do you feel physically threatened by others?: No      10/20/2023    3:44 PM  Depression screen PHQ 2/9   Decreased Interest 0  Down, Depressed, Hopeless 1  PHQ - 2 Score 1    There were no vitals filed for this visit.  Medications Reviewed Today     Reviewed by Karoline Lima, RN (Registered Nurse) on 10/20/23 at 1506  Med List Status: <None>   Medication Order Taking? Sig Documenting Provider Last Dose Status Informant  gabapentin  (NEURONTIN ) 300 MG capsule [id number]  TAKE 1 CAPSULE BY MOUTH TWICE A DAY Karamalegos, Alexander JINNY, DO  Active   hydrOXYzine  (VISTARIL ) 25 MG capsule [id number]  TAKE 1 CAPSULE (25 MG TOTAL) BY MOUTH AT BEDTIME AS  NEEDED. Antonette Angeline ORN, NP  Active   meloxicam  (MOBIC ) 7.5 MG tablet [id number]  Take 1 tablet (7.5 mg total) by mouth daily.  Patient not taking: Reported on 10/20/2023   Antonette Angeline ORN, NP  Active   Semaglutide -Weight Management 1 MG/0.5ML EMMANUEL [id number]  Inject 1 mg into the skin once a week.  Patient not taking: Reported on 10/20/2023   Antonette Angeline ORN, NP  Active   traZODone  (DESYREL ) 50 MG tablet [id number] Yes TAKE 3 TABLETS (150 MG TOTAL) BY MOUTH AT BEDTIME AS NEEDED FOR SLEEP. Antonette Angeline ORN, NP  Active             Recommendation:   PCP Follow-up on November 21, 2023 Continue Current Plan of Care  Follow Up Plan:   Telephone follow up appointment with Nurse Case Manager on October 31, 2023   [facility] Ac[facility] RN Care Manager Direct Dial: [phone]  Fax: [phone] Website: [email or web address]

## 2023-10-20 NOTE — Patient Instructions (Signed)
 Visit Information  Terri Conner was given information about Medicaid Managed Care team care coordination services as a part of their [place] benefit. Terri Conner verbally consented to engagement with the [facility] Managed Care team.   If you are experiencing a medical emergency, please call 911 or report to your local emergency department or urgent care.   If you have a non-emergency medical problem during routine business hours, please contact your provider's office and ask to speak with a nurse.   For questions related to your [place] health plan, please call: [phone]  If you would like to schedule transportation through your [place] plan, please call the following number at least 2 days in advance of your appointment: [phone].   There is no limit to the number of trips during the year between medical appointments, healthcare facilities, or pharmacies. Transportation must be scheduled at least 2 business days before but not more than thirty 30 days before of your appointment.  Call the Behavioral Health Crisis Line at [phone], at any time, 24 hours a day, 7 days a week. If you are in danger or need immediate medical attention call 911.  If you would like help to quit smoking, call 1-800-QUIT-NOW ((445)317-6047) OR Espaol: 1-855-Djelo-Ya (8-144-664-6430) o para ms informacin haga clic aqu or Text READY to 799-599 to register via text  Terri Conner - following are the goals we discussed in your visit today:   Goals Addressed             This Visit's Progress    VBCI RN Care Plan       Problems:  Chronic Disease Management support and education needs related to Migraines   Goal: Over the next 90 days the patient will: Attend all scheduled medical appointments Take all medications exactly as prescribed  Demonstrate ongoing adherence to prescribed treatment plan for management of migraines Schedule Eye  exam Collaborate with the clinic and care management team regarding care needs.   Interventions:  Health Maintenance Interventions: Patient interviewed about adult health maintenance status. Discussed plan to complete annual eye exam. She is agreeable to completing the exam, however notes she is currently busy with pending divorce and considering arrangements for relocating.  Provided information for accessing [email or web address]. Patient agreed to review site if resources are needed.Agreed to keep the care management team updated of care needs.   Migraine Interventions:(Status: Goal on track ) Long Term Goal  Pain assessment performed Medications reviewed Reviewed provider established plan for management of migraines Discussed importance of adherence to all scheduled medical appointments Counseled on the importance of reporting any/all new or changed pain symptoms or management strategies to Primary Care Provider Discussed use of relaxation techniques and/or diversional activities to assist with pain reduction Reviewed with patient prescribed pharmacological and nonpharmacological pain relief strategies Screening for signs and symptoms of depression related to chronic disease state  Assessed social determinant of health barriers    Patient Self-Care Activities:  Attend all scheduled provider appointments Call pharmacy for medication refills 3-7 days in advance of running out of medications Call provider office for new concerns or questions  Perform all self care activities independently  Take medications as prescribed    Plan:  Telephone follow up appointment with care management team member scheduled for: October 31, 2023             Please see education materials related to Migraines and Heat Exhaustion Prevention provided by MyChart link.  Patient verbalizes understanding  of instructions and care plan provided today and agrees to view in MyChart. Active MyChart status and patient  understanding of how to access instructions and care plan via MyChart confirmed with patient.     RN Care Manager will follow up on October 31, 2023   [facility] Ac[facility] RN Care Manager Direct Dial: [phone]  Fax: [phone] Website: [email or web address]

## 2023-10-22 ENCOUNTER — Other Ambulatory Visit: Payer: Self-pay | Admitting: Internal Medicine

## 2023-10-24 NOTE — Telephone Encounter (Signed)
 Requested Prescriptions  Pending Prescriptions Disp Refills   hydrOXYzine  (VISTARIL ) 25 MG capsule [Pharmacy Med Name: HYDROXYZINE  PAM 25 MG CAP] 90 capsule 1    Sig: TAKE 1 CAPSULE (25 MG TOTAL) BY MOUTH AT BEDTIME AS NEEDED.     Ear, Nose, and Throat:  Antihistamines 2 Failed - 10/24/2023  2:13 PM      Failed - Cr in normal range and within 360 days    Creat  Date Value Ref Range Status  09/29/2022 0.79 0.50 - 1.03 mg/dL Final         Passed - Valid encounter within last 12 months    Recent Outpatient Visits           5 months ago Rash and nonspecific skin eruption   [place] [facility] [place], Angeline ORN, NP       Future Appointments             In 4 weeks [place], Angeline ORN, NP [facility], [facility]

## 2023-10-30 ENCOUNTER — Other Ambulatory Visit: Payer: Self-pay | Admitting: Medical Genetics

## 2023-10-31 ENCOUNTER — Other Ambulatory Visit: Payer: Self-pay

## 2023-10-31 NOTE — Patient Instructions (Signed)
 Visit Information  Ms. Sproule was given information about Medicaid Managed Care team care coordination services as a part of their [place].   If you would like to schedule transportation through your [place] plan, please call the following number at least 2 days in advance of your appointment: [phone].   There is no limit to the number of trips during the year between medical appointments, healthcare facilities, or pharmacies. Transportation must be scheduled at least 2 business days before but not more than thirty 30 days before of your appointment.  Call the Behavioral Health Crisis Line at [phone], at any time, 24 hours a day, 7 days a week. If you are in danger or need immediate medical attention call 911.   Ms. Steury - following are the goals we discussed in your visit today:   Please see education materials related to provided by MyChart link.  Patient verbalizes understanding of instructions and care plan provided today and agrees to view in MyChart. Active MyChart status and patient understanding of how to access instructions and care plan via MyChart confirmed with patient.     RN Care Manager will   SIG  Following is a copy of your plan of care:  There are no care plans that you recently modified to display for this patient.

## 2023-10-31 NOTE — Patient Outreach (Unsigned)
 Complex Care Management   Visit Note  10/31/2023  Name:  Terri Conner MRN: [id number] DOB: 12/23/1970  Situation: Referral received for Complex Care Management related to {Criteria:32550} I obtained verbal consent from {CHL AMB Patient/Caregiver:28184}.  Visit completed with ***  {VISIT LOCATION:32553}  Background:   Past Medical History:  Diagnosis Date   Migraines     Assessment: Patient Reported Symptoms:  Cognitive        Neurological      HEENT        Cardiovascular      Respiratory      Endocrine      Gastrointestinal        Genitourinary      Integumentary      Musculoskeletal          Psychosocial              10/20/2023    3:44 PM  Depression screen PHQ 2/9  Decreased Interest 0  Down, Depressed, Hopeless 1  PHQ - 2 Score 1    There were no vitals filed for this visit.  Medications Reviewed Today     Reviewed by Karoline Lima, RN (Registered Nurse) on 10/31/23 at 2303  Med List Status: <None>   Medication Order Taking? Sig Documenting Provider Last Dose Status Informant  gabapentin  (NEURONTIN ) 300 MG capsule [id number]  TAKE 1 CAPSULE BY MOUTH TWICE A DAY Karamalegos, Alexander JINNY, DO  Active   hydrOXYzine  (VISTARIL ) 25 MG capsule [id number]  TAKE 1 CAPSULE (25 MG TOTAL) BY MOUTH AT BEDTIME AS NEEDED. Antonette Angeline ORN, NP  Active   meloxicam  (MOBIC ) 7.5 MG tablet [id number]  Take 1 tablet (7.5 mg total) by mouth daily.  Patient not taking: Reported on 10/20/2023   Antonette Angeline ORN, NP  Active   Semaglutide -Weight Management 1 MG/0.5ML EMMANUEL [id number]  Inject 1 mg into the skin once a week.  Patient not taking: Reported on 10/20/2023   Antonette Angeline ORN, NP  Active   traZODone  (DESYREL ) 50 MG tablet [id number]  TAKE 3 TABLETS (150 MG TOTAL) BY MOUTH AT BEDTIME AS NEEDED FOR SLEEP. Antonette Angeline ORN, NP  Active             Recommendation:   {RECOMMENDATONS:32554}  Follow Up Plan:   {FOLLOWUP:32559}  SIG ***

## 2023-11-06 ENCOUNTER — Other Ambulatory Visit

## 2023-11-07 ENCOUNTER — Ambulatory Visit: Admitting: Internal Medicine

## 2023-11-07 ENCOUNTER — Other Ambulatory Visit (HOSPITAL_COMMUNITY)
Admission: RE | Admit: 2023-11-07 | Discharge: 2023-11-07 | Disposition: A | Discharge status: Home / Self Care | Source: Ambulatory Visit | Attending: Internal Medicine | Admitting: Internal Medicine

## 2023-11-07 ENCOUNTER — Encounter: Payer: Self-pay | Admitting: Internal Medicine

## 2023-11-07 ENCOUNTER — Ambulatory Visit (INDEPENDENT_AMBULATORY_CARE_PROVIDER_SITE_OTHER): Admitting: Internal Medicine

## 2023-11-07 ENCOUNTER — Other Ambulatory Visit

## 2023-11-07 VITALS — BP 130/70 | HR 60 | Ht 62.0 in | Wt 243.1 lb

## 2023-11-07 DIAGNOSIS — Z0001 Encounter for general adult medical examination with abnormal findings: Secondary | ICD-10-CM | POA: Diagnosis not present

## 2023-11-07 DIAGNOSIS — R739 Hyperglycemia, unspecified: Secondary | ICD-10-CM | POA: Diagnosis not present

## 2023-11-07 DIAGNOSIS — Z6841 Body Mass Index (BMI) 40.0 and over, adult: Secondary | ICD-10-CM | POA: Diagnosis not present

## 2023-11-07 DIAGNOSIS — N898 Other specified noninflammatory disorders of vagina: Secondary | ICD-10-CM | POA: Insufficient documentation

## 2023-11-07 DIAGNOSIS — L308 Other specified dermatitis: Secondary | ICD-10-CM | POA: Diagnosis not present

## 2023-11-07 DIAGNOSIS — Z136 Encounter for screening for cardiovascular disorders: Secondary | ICD-10-CM | POA: Diagnosis not present

## 2023-11-07 DIAGNOSIS — E66813 Obesity, class 3: Secondary | ICD-10-CM | POA: Diagnosis not present

## 2023-11-07 MED ORDER — PREDNISONE 10 MG PO TABS: ORAL_TABLET | ORAL | 0 refills | Status: DC

## 2023-11-07 MED ORDER — FEXOFENADINE HCL 180 MG PO TABS: 180.0000 mg | ORAL_TABLET | Freq: Every day | ORAL | 1 refills | Status: AC

## 2023-11-07 MED ORDER — MOMETASONE FUROATE 0.1 % EX CREA: TOPICAL_CREAM | Freq: Every day | CUTANEOUS | 0 refills | Status: DC

## 2023-11-07 NOTE — Assessment & Plan Note (Signed)
Encourage low-carb diet and exercise for weight loss 

## 2023-11-07 NOTE — Progress Notes (Signed)
 Subjective:    Patient ID: Terri Conner, female    DOB: October 14, 1970, 53 y.o.   MRN: [id number]  HPI  Patient presents to clinic today for her annual exam.  Flu: 01/2023 Tetanus: 03/2022 COVID: x 2 Shingrix: Never Pap smear: > 5 years ago Mammogram: 04/2023 Colon screening: never Vision screening: as needed Dentist: no teeth  Diet: She does eat meat. She consumes more fruits than veggies. She does eat some fried foods. She drinks mostly coffee, water, sports drinks. Exercise: None  Review of Systems  Past Medical History:  Diagnosis Date   Migraines     Current Outpatient Medications  Medication Sig Dispense Refill   gabapentin  (NEURONTIN ) 300 MG capsule TAKE 1 CAPSULE BY MOUTH TWICE A DAY 180 capsule 1   hydrOXYzine  (VISTARIL ) 25 MG capsule TAKE 1 CAPSULE (25 MG TOTAL) BY MOUTH AT BEDTIME AS NEEDED. 30 capsule 2   meloxicam  (MOBIC ) 7.5 MG tablet Take 1 tablet (7.5 mg total) by mouth daily. (Patient not taking: Reported on 10/20/2023) 90 tablet 1   Semaglutide -Weight Management 1 MG/0.5ML SOAJ Inject 1 mg into the skin once a week. (Patient not taking: Reported on 10/20/2023) 6 mL 0   traZODone  (DESYREL ) 50 MG tablet TAKE 3 TABLETS (150 MG TOTAL) BY MOUTH AT BEDTIME AS NEEDED FOR SLEEP. 270 tablet 0   No current facility-administered medications for this visit.    No Known Allergies  Family History  Problem Relation Age of Onset   Anxiety disorder Mother    Depression Mother    Heart attack Father 73   Alcohol abuse Father    Alcohol abuse Sister    Lung cancer Maternal Grandmother    Breast cancer Neg Hx     Social History   Socioeconomic History   Marital status: Married    Spouse name: Not on file   Number of children: Not on file   Years of education: Not on file   Highest education level: Not on file  Occupational History   Not on file  Tobacco Use   Smoking status: Every Day    Average packs/day: 1 pack/day for 18.0 years (18.0 ttl pk-yrs)     Types: Cigarettes    Start date: 02/12/2005   Smokeless tobacco: Former  Building services engineer status: Never Used  Substance and Sexual Activity   Alcohol use: Yes    Comment: social   Drug use: Not Currently   Sexual activity: Not on file  Other Topics Concern   Not on file  Social History Narrative   Not on file   Social Drivers of Health   Financial Resource Strain: Low Risk  (10/20/2023)   Overall Financial Resource Strain (CARDIA)    Difficulty of Paying Living Expenses: Not very hard  Food Insecurity: No Food Insecurity (10/20/2023)   Hunger Vital Sign    Worried About Running Out of Food in the Last Year: Never true    Ran Out of Food in the Last Year: Never true  Transportation Needs: No Transportation Needs (10/20/2023)   PRAPARE - Administrator, Civil Service (Medical): No    Lack of Transportation (Non-Medical): No  Physical Activity: Inactive (03/06/2023)   Exercise Vital Sign    Days of Exercise per Week: 0 days    Minutes of Exercise per Session: 0 min  Stress: Stress Concern Present (10/20/2023)   [employer] of Occupational Health - Occupational Stress Questionnaire    Feeling of Stress: To some  extent  Social Connections: Moderately Isolated (03/06/2023)   Social Connection and Isolation Panel    Frequency of Communication with Friends and Family: More than three times a week    Frequency of Social Gatherings with Friends and Family: Once a week    Attends Religious Services: Never    Database administrator or Organizations: No    Attends Banker Meetings: Never    Marital Status: Married  Catering manager Violence: Not At Risk (10/20/2023)   Humiliation, Afraid, Rape, and Kick questionnaire    Fear of Current or Ex-Partner: No    Emotionally Abused: No    Physically Abused: No    Sexually Abused: No     Constitutional: Patient reports intermittent headaches.  Denies fever, malaise, fatigue, or abrupt weight changes.  HEENT:  Denies eye pain, eye redness, ear pain, ringing in the ears, wax buildup, runny nose, nasal congestion, bloody nose, or sore throat. Respiratory: Denies difficulty breathing, shortness of breath, cough or sputum production.   Cardiovascular: Denies chest pain, chest tightness, palpitations or swelling in the hands or feet.  Gastrointestinal: Patient reports intermittent reflux.  Denies abdominal pain, bloating, constipation, diarrhea or blood in the stool.  GU: Pt reports vaginal odor. Denies urgency, frequency, pain with urination, burning sensation, blood in urine, odor or discharge. Musculoskeletal: Denies decrease in range of motion, difficulty with gait, muscle pain or joint swelling.  Skin: Patient reports dry skin.  Denies redness, lesions or ulcercations.  Neurological: Patient reports insomnia, restless legs, hot flashes.  Denies dizziness, difficulty with memory, difficulty with speech or problems with balance and coordination.  Psych: Patient has a history of anxiety and depression.  Denies SI/HI.  No other specific complaints in a complete review of systems (except as listed in HPI above).     Objective:   Physical Exam BP 130/70 (BP Location: Left Arm, Patient Position: Sitting, Cuff Size: Normal)   Pulse 60   Ht 5' 2 (1.575 m)   Wt 243 lb 2 oz (110.3 kg)   LMP 01/14/2019 (Exact Date)   SpO2 98%   BMI 44.47 kg/m    Wt Readings from Last 3 Encounters:  05/22/23 248 lb 6.4 oz (112.7 kg)  04/18/23 252 lb (114.3 kg)  03/06/23 252 lb 6.4 oz (114.5 kg)    General: Appears her stated age, obese, in NAD. Skin: Warm, dry and intact. Dyshidrotic eczema noted of hands.  HEENT: Head: normal shape and size; Eyes: sclera white, no icterus, conjunctiva pink, PERRLA and EOMs intact;  Neck:  Neck supple, trachea midline. No masses, lumps or thyromegaly present.  Cardiovascular: Normal rate and rhythm. S1,S2 noted.  No murmur, rubs or gallops noted. No JVD or BLE edema. No carotid  bruits noted. Pulmonary/Chest: Normal effort and positive vesicular breath sounds. No respiratory distress. No wheezes, rales or ronchi noted.  Abdomen: Normal bowel sounds.  Musculoskeletal: Strength 5/5 BUE/BLE. No difficulty with gait.  Neurological: Alert and oriented. Cranial nerves II-XII grossly intact. Coordination normal.  Psychiatric: Mood and affect normal. Behavior is normal. Judgment and thought content normal.     BMET    Component Value Date/Time   NA 140 09/29/2022 1443   K 4.3 09/29/2022 1443   CL 108 09/29/2022 1443   CO2 26 09/29/2022 1443   GLUCOSE 91 09/29/2022 1443   BUN 18 09/29/2022 1443   CREATININE 0.79 09/29/2022 1443   CALCIUM 9.2 09/29/2022 1443   GFRNONAA >60 04/12/2022 2056   GFRAA >60 10/14/2019 1233  Lipid Panel     Component Value Date/Time   CHOL 113 09/29/2022 1443   TRIG 86 09/29/2022 1443   HDL 45 (L) 09/29/2022 1443   CHOLHDL 2.5 09/29/2022 1443   LDLCALC 51 09/29/2022 1443    CBC    Component Value Date/Time   WBC 7.6 09/29/2022 1443   RBC 5.24 (H) 09/29/2022 1443   HGB 9.2 (L) 09/29/2022 1443   HCT 33.9 (L) 09/29/2022 1443   PLT 206 09/29/2022 1443   MCV 64.7 (L) 09/29/2022 1443   MCH 17.6 (L) 09/29/2022 1443   MCHC 27.1 (L) 09/29/2022 1443   RDW 17.3 (H) 09/29/2022 1443   LYMPHSABS 2.6 12/08/2021 1928   MONOABS 0.6 12/08/2021 1928   EOSABS 0.1 12/08/2021 1928   BASOSABS 0.1 12/08/2021 1928    Hgb A1C Lab Results  Component Value Date   HGBA1C 5.4 09/29/2022            Assessment & Plan:   Preventative health maintenance:  Encouraged her to get a flu shot in the fall Tetanus UTD Encouraged her to get her COVID-vaccine Discussed Shingrix vaccine, she will check coverage with her insurance company and schedule visit if she would like to have this done She declines pap smear today Mammogram UTD She declines referral to GI for screening colonoscopy or cologuard at this time Encouraged her to consume a  balanced diet and exercise regimen Advised her to see an eye doctor and dentist annually We will check CBC, c-Met, lipid, A1c today  Vaginal odor:  Will check wet prep for BV and yeast She declines STD screening at this time  Eczema:  Avoid excessively hot water Use a moisturizer relation to times daily Rx for Pred taper x 6 days Rx for mometasone  0.1% daily as needed She has an appointment with dermatology in October  RTC in 6 months, follow-up chronic conditions Angeline Laura, NP

## 2023-11-07 NOTE — Patient Instructions (Signed)

## 2023-11-08 ENCOUNTER — Other Ambulatory Visit (HOSPITAL_COMMUNITY): Payer: Self-pay

## 2023-11-08 ENCOUNTER — Ambulatory Visit: Payer: Self-pay | Admitting: Internal Medicine

## 2023-11-08 ENCOUNTER — Telehealth: Payer: Self-pay

## 2023-11-08 LAB — CBC
HCT: 35 % (ref 35.0–45.0)
Hemoglobin: 9.5 g/dL — ABNORMAL LOW (ref 11.7–15.5)
MCH: 18.7 pg — ABNORMAL LOW (ref 27.0–33.0)
MCHC: 27.1 g/dL — ABNORMAL LOW (ref 32.0–36.0)
MCV: 68.8 fL — ABNORMAL LOW (ref 80.0–100.0)
MPV: 10.9 fL (ref 7.5–12.5)
Platelets: 225 Thousand/uL (ref 140–400)
RBC: 5.09 Million/uL (ref 3.80–5.10)
RDW: 16.6 % — ABNORMAL HIGH (ref 11.0–15.0)
WBC: 4.9 Thousand/uL (ref 3.8–10.8)

## 2023-11-08 LAB — COMPREHENSIVE METABOLIC PANEL WITH GFR
AG Ratio: 1.7 (calc) (ref 1.0–2.5)
ALT: 18 U/L (ref 6–29)
AST: 18 U/L (ref 10–35)
Albumin: 3.8 g/dL (ref 3.6–5.1)
Alkaline phosphatase (APISO): 81 U/L (ref 37–153)
BUN: 13 mg/dL (ref 7–25)
CO2: 31 mmol/L (ref 20–32)
Calcium: 9.1 mg/dL (ref 8.6–10.4)
Chloride: 106 mmol/L (ref 98–110)
Creat: 0.73 mg/dL (ref 0.50–1.03)
Globulin: 2.2 g/dL (ref 1.9–3.7)
Glucose, Bld: 85 mg/dL (ref 65–99)
Potassium: 4.8 mmol/L (ref 3.5–5.3)
Sodium: 141 mmol/L (ref 135–146)
Total Bilirubin: 0.4 mg/dL (ref 0.2–1.2)
Total Protein: 6 g/dL — ABNORMAL LOW (ref 6.1–8.1)
eGFR: 98 mL/min/1.73m2 (ref >=60)

## 2023-11-08 LAB — LIPID PANEL
Cholesterol: 113 mg/dL (ref <200)
HDL: 47 mg/dL — ABNORMAL LOW (ref >=50)
LDL Cholesterol (Calc): 52 mg/dL
Non-HDL Cholesterol (Calc): 66 mg/dL (ref <130)
Total CHOL/HDL Ratio: 2.4 (calc) (ref <5.0)
Triglycerides: 62 mg/dL (ref <150)

## 2023-11-08 LAB — HEMOGLOBIN A1C
Hgb A1c MFr Bld: 5.6 % (ref <5.7)
Mean Plasma Glucose: 114 mg/dL
eAG (mmol/L): 6.3 mmol/L

## 2023-11-08 NOTE — Telephone Encounter (Signed)
 Pharmacy Patient Advocate Encounter   Received notification from CoverMyMeds that prior authorization for Fexofenadine  HCl 180MG  tablets  is required/requested.   Insurance verification completed.   The patient is insured through [employer] .   Per test claim: PA required; PA started via CoverMyMeds. KEY [id number] . Please see clinical question(s) below that I am not finding the answer to in their chart and advise.

## 2023-11-09 LAB — CERVICOVAGINAL ANCILLARY ONLY
Bacterial Vaginitis (gardnerella): POSITIVE — AB
Candida Glabrata: NEGATIVE
Candida Vaginitis: NEGATIVE
Comment: NEGATIVE
Comment: NEGATIVE
Comment: NEGATIVE

## 2023-11-09 MED ORDER — METRONIDAZOLE 500 MG PO TABS: 500.0000 mg | ORAL_TABLET | Freq: Two times a day (BID) | ORAL | 0 refills | Status: DC

## 2023-11-09 NOTE — Telephone Encounter (Signed)
 No need to do prior Auth on this.  She can buy this over-the-counter.  Please notify patient that insurance would not cover this and she will need to get it OTC.

## 2023-11-10 ENCOUNTER — Encounter: Payer: Self-pay | Admitting: Internal Medicine

## 2023-11-10 ENCOUNTER — Other Ambulatory Visit (HOSPITAL_COMMUNITY): Payer: Self-pay

## 2023-11-13 MED ORDER — METRONIDAZOLE 500 MG PO TABS: 500.0000 mg | ORAL_TABLET | Freq: Two times a day (BID) | ORAL | 0 refills | Status: DC

## 2023-11-14 ENCOUNTER — Telehealth: Payer: Self-pay | Admitting: Pharmacy Technician

## 2023-11-14 NOTE — Telephone Encounter (Signed)
 Pharmacy Patient Advocate Encounter  Received notification from [place] [employer] that Prior Authorization for Fexofenadine  180mg  tablets has been APPROVED from 11/10/2023 to 11/09/2024.   PA #/Case ID/Reference #: [id number]

## 2023-11-21 ENCOUNTER — Ambulatory Visit: Payer: 59 | Admitting: Internal Medicine

## 2023-11-23 ENCOUNTER — Other Ambulatory Visit: Payer: Self-pay | Admitting: Internal Medicine

## 2023-11-23 NOTE — Telephone Encounter (Signed)
 Requested Prescriptions  Pending Prescriptions Disp Refills   traZODone  (DESYREL ) 50 MG tablet [Pharmacy Med Name: TRAZODONE  50 MG TABLET] 270 tablet 1    Sig: TAKE 3 TABLETS (150 MG TOTAL) BY MOUTH AT BEDTIME AS NEEDED FOR SLEEP.     Psychiatry: Antidepressants - Serotonin Modulator Passed - 11/23/2023  4:51 PM      Passed - Completed PHQ-2 or PHQ-9 in the last 360 days      Passed - Valid encounter within last 6 months    Recent Outpatient Visits           2 weeks ago Encounter for general adult medical examination with abnormal findings   [facility] [place], Angeline ORN, NP   6 months ago Rash and nonspecific skin eruption   [facility] [place], Angeline ORN, [place]

## 2023-11-24 ENCOUNTER — Other Ambulatory Visit: Payer: Self-pay

## 2023-11-24 NOTE — Patient Outreach (Signed)
 Complex Care Management   Visit Note  11/24/2023  Name:  Terri Conner MRN: [id number] DOB: [birth year]  Situation: Referral received for Complex Care Management related to Migraines. I obtained verbal consent from Patient.  Visit completed with Ms. Seif via telephone.  Background:   Past Medical History:  Diagnosis Date   Migraines    Assessment: Patient Reported Symptoms: Cognitive Cognitive Status: Alert and oriented to person, place, and time, Normal speech and language skills Cognitive/Intellectual Conditions Management [RPT]: None reported or documented in medical history or problem list Health Maintenance Behaviors: Annual physical exam Healing Pattern: Average Health Facilitated by: Rest  Neurological Neurological Review of Symptoms: No symptoms reported Neurological Management Strategies: Medication therapy, Routine screening, Coping strategies Neurological Self-Management Outcome: 4 (good)  HEENT HEENT Symptoms Reported: No symptoms reported HEENT Management Strategies: Routine screening HEENT Self-Management Outcome: 4 (good)  Cardiovascular Cardiovascular Symptoms Reported: No symptoms reported Does patient have uncontrolled Hypertension?: No Cardiovascular Management Strategies: Routine screening Cardiovascular Self-Management Outcome: 4 (good)  Respiratory Respiratory Symptoms Reported: No symptoms reported Respiratory Management Strategies: Routine screening Respiratory Self-Management Outcome: 4 (good)  Endocrine Endocrine Symptoms Reported: No symptoms reported Is patient diabetic?: No Endocrine Self-Management Outcome: 4 (good)  Gastrointestinal Gastrointestinal Symptoms Reported: No symptoms reported Gastrointestinal Self-Management Outcome: 4 (good)  Genitourinary Genitourinary Symptoms Reported: No symptoms reported Genitourinary Self-Management Outcome: 4 (good)  Integumentary Integumentary Symptoms Reported: No symptoms reported Skin Management  Strategies: Routine screening Skin Self-Management Outcome: 4 (good)  Musculoskeletal Musculoskelatal Symptoms Reviewed: No symptoms reported Musculoskeletal Management Strategies: Routine screening Musculoskeletal Self-Management Outcome: 4 (good)  Psychosocial Psychosocial Symptoms Reported: No symptoms reported Behavioral Management Strategies: Coping strategies, Medication therapy Behavioral Health Self-Management Outcome: 4 (good) Major Change/Loss/Stressor/Fears (CP): Legal concerns (Reports pending divorce but managing well.) Behaviors When Feeling Stressed/Fearful: Reports resting and reflection Techniques to Cope with Loss/Stress/Change: Diversional activities Quality of Family Relationships: supportive (Reports support from friends) Do you feel physically threatened by others?: No   There were no vitals filed for this visit.  Medications Reviewed Today     Reviewed by Karoline Lima, RN (Registered Nurse) on 11/27/23 at [phone]  Med List Status: <None>   Medication Order Taking? Sig Documenting Provider Last Dose Status Informant  fexofenadine  (ALLEGRA  ALLERGY) 180 MG tablet [id number]  Take 1 tablet (180 mg total) by mouth daily. Antonette Angeline ORN, NP  Active   gabapentin  (NEURONTIN ) 300 MG capsule [id number]  TAKE 1 CAPSULE BY MOUTH TWICE A DAY Karamalegos, Alexander JINNY, DO  Active   hydrOXYzine  (VISTARIL ) 25 MG capsule [id number]  TAKE 1 CAPSULE (25 MG TOTAL) BY MOUTH AT BEDTIME AS NEEDED. Antonette Angeline ORN, NP  Active   metroNIDAZOLE  (FLAGYL ) 500 MG tablet [id number]  Take 1 tablet (500 mg total) by mouth 2 (two) times daily. Do not drink alcohol while taking this medicine. Antonette Angeline ORN, NP  Active   mometasone  (ELOCON ) 0.1 % cream [id number]  Apply topically daily. Antonette Angeline ORN, NP  Active   predniSONE  (DELTASONE ) 10 MG tablet [id number]  Take 6 tabs on day 1, 5 tabs on day 2, 4 tabs on day 3, 3 tabs on day 4, 2 tabs on day 5, 1 tab on day 6 Baity, Regina W, NP  Active   traZODone   (DESYREL ) 50 MG tablet [id number]  TAKE 3 TABLETS (150 MG TOTAL) BY MOUTH AT BEDTIME AS NEEDED FOR SLEEP. Antonette Angeline ORN, NP  Active             Recommendation:   Continue Current  Plan of Care  Follow Up Plan:   Telephone follow up appointment with Nurse Case Manager on December 17, 2023   Jackson Acron [facility] RN Care Manager Direct Dial: [phone]  Fax: [phone] Website: [email or web address]

## 2023-11-27 NOTE — Patient Instructions (Signed)
 Thank you for allowing the Complex Care Management team to participate in your care. It was great speaking with you!  Reminders: -Please remember to schedule your Eye Exam.  We will follow up on December 22, 2023 at 2 pm. Please do not hesitate to contact me if you require assistance prior to our next outreach.  Visit Information  Ms. Talerico was given information about Medicaid Managed Care team care coordination services as a part of their [place] [employer] benefit.   If you would like to schedule transportation through your [place] [employer] plan, please call the following number at least 2 days in advance of your appointment: [phone].   There is no limit to the number of trips during the year between medical appointments, healthcare facilities, or pharmacies. Transportation must be scheduled at least 2 business days before but not more than thirty 30 days before of your appointment.  Call the Behavioral Health Crisis Line at [phone], at any time, 24 hours a day, 7 days a week. If you are in danger or need immediate medical attention call 911.   Ms. Ilg - following are the goals we discussed in your visit today:   Goals Addressed             This Visit's Progress    VBCI RN Care Plan       Problems:  Chronic Disease Management support and education needs related to Migraines   Goal: Over the next 90 days the patient will: Attend all scheduled medical appointments Take all medications exactly as prescribed  Demonstrate ongoing adherence to prescribed treatment plan for management of migraines Schedule Eye exam Collaborate with the clinic and care management team regarding care needs.   Interventions:  Health Maintenance Interventions: Patient interviewed about adult health maintenance status. Discussed plan to complete annual eye exam. She is agreeable to completing the exam, however notes she is currently busy with pending divorce and  considering arrangements for relocating. Offered resources for assistance with housing. Declined current need for assistance at this time. Agreed to update the care team if assistance is required. Provided information for accessing [email or web address]. Patient agreed to review site if resources are needed.  Migraine Interventions:(Status: Goal on track ) Long Term Goal  Pain assessment performed Medications reviewed Reviewed provider established plan for management of migraines Discussed importance of adherence to all scheduled medical appointments Counseled on the importance of reporting any/all new or changed pain symptoms or management strategies to Primary Care Provider.  Reports managing migraines well. Reviewed use of relaxation techniques and/or diversional activities to assist with pain reduction Reviewed with patient prescribed pharmacological and nonpharmacological pain relief strategies Screening for signs and symptoms of depression related to chronic disease state  Discussed plan for relocation.  Denies current need for assistance. Agreed to keep the care team updated of care management needs.    Patient Self-Care Activities:  Attend all scheduled provider appointments Call pharmacy for medication refills 3-7 days in advance of running out of medications Call provider office for new concerns or questions  Perform all self care activities independently  Take medications as prescribed    Plan:  Telephone follow up appointment with care management team member scheduled for: December 22, 2023             Patient verbalizes understanding of instructions and care plan provided today and agrees to view in MyChart. Active MyChart status and patient understanding of how to access instructions and care plan via MyChart confirmed with  patient.     RN Care Manager will follow up on December 22, 2023 at 2 pm.   Jackson Acron [facility] RN Care Manager Direct  Dial: [phone]  Fax: [phone] Website: [email or web address]

## 2023-12-22 ENCOUNTER — Telehealth: Payer: Self-pay

## 2024-01-05 ENCOUNTER — Other Ambulatory Visit: Payer: Self-pay | Admitting: Internal Medicine

## 2024-01-08 NOTE — Telephone Encounter (Signed)
 Requested Prescriptions  Pending Prescriptions Disp Refills   hydrOXYzine  (VISTARIL ) 25 MG capsule [Pharmacy Med Name: HYDROXYZINE  PAM 25 MG CAP] 90 capsule 0    Sig: TAKE 1 CAPSULE (25 MG TOTAL) BY MOUTH AT BEDTIME AS NEEDED.     Ear, Nose, and Throat:  Antihistamines 2 Passed - 01/08/2024 12:39 PM      Passed - Cr in normal range and within 360 days    Creat  Date Value Ref Range Status  11/07/2023 0.73 0.50 - 1.03 mg/dL Final         Passed - Valid encounter within last 12 months    Recent Outpatient Visits           2 months ago Encounter for general adult medical examination with abnormal findings   [facility] [place], [place] W, NP   7 months ago Rash and nonspecific skin eruption   [facility] [place], Angeline ORN, [place]

## 2024-01-31 ENCOUNTER — Other Ambulatory Visit: Payer: Self-pay | Admitting: Medical Genetics

## 2024-01-31 DIAGNOSIS — Z006 Encounter for examination for normal comparison and control in clinical research program: Secondary | ICD-10-CM

## 2024-02-07 ENCOUNTER — Telehealth: Payer: Self-pay

## 2024-02-07 NOTE — Telephone Encounter (Signed)
 Copied from CRM [id number]. Topic: General - Other >> Feb 07, 2024  9:20 AM Donna BRAVO wrote: Reason for CRM: [facility] and the [facility] part needs filled out. Patient appt  is 02/08/24 at 1:30  Lauraine could not provide 3 identifiers  Lauraine had different address and phone number.  Sarah phone [phone] ext [phone]

## 2024-02-07 NOTE — Telephone Encounter (Signed)
 Tried museum/gallery exhibitions officer dermatology. Couldn't get to Lauraine. Please provide her with the correct fax number when she calls back we need the form faxed to us  to complete

## 2024-02-08 ENCOUNTER — Ambulatory Visit: Admitting: Internal Medicine

## 2024-02-12 ENCOUNTER — Encounter: Payer: Self-pay | Admitting: Internal Medicine

## 2024-02-12 ENCOUNTER — Ambulatory Visit (INDEPENDENT_AMBULATORY_CARE_PROVIDER_SITE_OTHER): Admitting: Internal Medicine

## 2024-02-12 VITALS — BP 132/72 | Ht 62.0 in | Wt 259.8 lb

## 2024-02-12 DIAGNOSIS — N951 Menopausal and female climacteric states: Secondary | ICD-10-CM | POA: Diagnosis not present

## 2024-02-12 MED ORDER — PAROXETINE HCL ER 12.5 MG PO TB24: 12.5000 mg | ORAL_TABLET | Freq: Every day | ORAL | 1 refills | Status: DC

## 2024-02-12 NOTE — Patient Instructions (Signed)
 Menopause and HRT (Hormone Replacement Therapy): What to Expect Menopause is the time in your life when your menstrual period stops, and your ovaries stop making certain hormones. These hormones are called estrogen and progesterone. Low levels of these hormones can affect your health and cause symptoms. Hormone replacement therapy (HRT) is a treatment that replaces the estrogen and progesterone in your body. Types of HRT  There are many types and doses of HRT. Talk with your health care provider about what form of HRT is best for you. HRT may include: Estrogen and progestin. This is more common if you have a uterus. Estrogen-only therapy. This is used if you don't have a uterus. You may be given the HRT as: Pills. Patches. Gels. Sprays. A vaginal cream. Vaginal rings. Vaginal inserts. How many hormones you need to take and how long you should take them depend on your health. You should: Start HRT at the lowest possible dose. Stop HRT as soon as you're told to. Work with your provider so you feel informed and comfortable with what happens. Tell a health care provider about: Any allergies you have. Whether you've had blood clots or are at risk for blood clots. Whether you or family members have had cancer, such as cancer of: The breasts. The ovaries. The uterus. Any surgeries you've had. Any medical problems you have. All medicines you take. These include vitamins, herbs, eye drops, and creams. Whether you're pregnant or may be pregnant. What are the benefits? HRT can help with the symptoms of menopause. The benefits depend on: What symptoms you have. How bad your symptoms are. Your overall health. Symptoms of menopause that HRT can help with include: Hot flashes and night sweats. Hot flashes are sudden feelings of heat that spread over your face and body. Your skin may turn red, like a blush. Night sweats are hot flashes that happen when you're sleeping or trying to  sleep. Bone loss. The body loses calcium faster after menopause. This can cause your bones to be weaker. They may be more likely to break. Vaginal dryness. The lining of the vagina can get thin and dry, which can cause: Pain during sex. Infection. Burning. Itching. Urinary tract infections. Not being able to control when you pee. Irritability, which means getting annoyed easily. Short-term memory problems. What are the risks? Risks depend on your health and medical history. They also depend on if you get estrogen and progestin or estrogen-only therapy. HRT may make you more likely to have: Spotting. This is when a small amount of blood leaks from your vagina when you don't expect it to. Endometrial cancer. This is cancer of the lining of the uterus. Breast cancer. Higher density of breast tissue. This can make it harder to find breast cancer on an X-ray. A stroke. Heart disease. Blood clots. Gallbladder or liver disease. You may be more at risk for problems if you have: Endometrial cancer. Liver disease. Heart disease. Breast cancer. A history of blood clots. A history of stroke. Follow these instructions at home: Take your medicines only as told. Do not smoke, vape, or use nicotine or tobacco. Go to your provider as often as told to get: Mammograms. Pelvic exams. Medical checkups. Contact a health care provider if: Your legs hurt or swell. You feel lumps or changes in your breasts or armpits. You feel pain, burning, or bleeding when you pee. You have bleeding from your vagina that's not normal. You feel dizzy or get headaches. You have pain in your belly.  Get help right away if: You have shortness of breath. You have chest pain. You have slurred speech. Any part of your arms or legs feels weak or numb. These symptoms may be an emergency. Call 911 right away. Do not wait to see if the symptoms will go away. Do not drive yourself to the hospital. This information is  not intended to replace advice given to you by your health care provider. Make sure you discuss any questions you have with your health care provider. Document Revised: 11/18/2022 Document Reviewed: 11/18/2022 Elsevier Patient Education  2024 [employer].

## 2024-02-12 NOTE — Progress Notes (Signed)
 Subjective:    Patient ID: Terri Conner, female    DOB: 06/09/1970, 53 y.o.   MRN: [id number]  HPI  Discussed the use of AI scribe software for clinical note transcription with the patient, who gave verbal consent to proceed.  Terri Conner is a 53 year old female who presents with menopausal symptoms including hot flashes and itching.  She has been experiencing significant menopausal symptoms over the past month, including severe hot flashes and intense itching, particularly at night. The itching is distinct from her known eczema. She also experiences night sweats and mood swings, which she describes as severe.  Menopause began at age 54, with her last menstrual period occurring around that time. She has noticed weight gain.  She is unsure if she is experiencing vaginal dryness that she has been single for some time.  In terms of social history, she quit smoking one year ago.  Family history is notable for a sister who had some form of cancer, possibly ovarian, although the details are unclear.       Review of Systems  Past Medical History:  Diagnosis Date   Migraines     Current Outpatient Medications  Medication Sig Dispense Refill   fexofenadine  (ALLEGRA  ALLERGY) 180 MG tablet Take 1 tablet (180 mg total) by mouth daily. 90 tablet 1   gabapentin  (NEURONTIN ) 300 MG capsule TAKE 1 CAPSULE BY MOUTH TWICE A DAY 180 capsule 1   hydrOXYzine  (VISTARIL ) 25 MG capsule TAKE 1 CAPSULE (25 MG TOTAL) BY MOUTH AT BEDTIME AS NEEDED. 90 capsule 0   metroNIDAZOLE  (FLAGYL ) 500 MG tablet Take 1 tablet (500 mg total) by mouth 2 (two) times daily. Do not drink alcohol while taking this medicine. 14 tablet 0   mometasone  (ELOCON ) 0.1 % cream Apply topically daily. 30 g 0   predniSONE  (DELTASONE ) 10 MG tablet Take 6 tabs on day 1, 5 tabs on day 2, 4 tabs on day 3, 3 tabs on day 4, 2 tabs on day 5, 1 tab on day 6 21 tablet 0   traZODone  (DESYREL ) 50 MG tablet TAKE 3 TABLETS (150 MG TOTAL) BY MOUTH  AT BEDTIME AS NEEDED FOR SLEEP. 270 tablet 1   No current facility-administered medications for this visit.    No Known Allergies  Family History  Problem Relation Age of Onset   Anxiety disorder Mother    Depression Mother    Heart attack Father 69   Alcohol abuse Father    Alcohol abuse Sister    Lung cancer Maternal Grandmother    Breast cancer Neg Hx     Social History   Socioeconomic History   Marital status: Married    Spouse name: Not on file   Number of children: Not on file   Years of education: Not on file   Highest education level: Not on file  Occupational History   Not on file  Tobacco Use   Smoking status: Former    Average packs/day: 1 pack/day for 18.0 years (18.0 ttl pk-yrs)    Types: Cigarettes    Start date: 02/12/2005   Smokeless tobacco: Former  Building Services Engineer status: Never Used  Substance and Sexual Activity   Alcohol use: Yes    Comment: social   Drug use: Not Currently   Sexual activity: Not on file  Other Topics Concern   Not on file  Social History Narrative   Not on file   Social Drivers of Health  Financial Resource Strain: Low Risk  (10/20/2023)   Overall Financial Resource Strain (CARDIA)    Difficulty of Paying Living Expenses: Not very hard  Food Insecurity: No Food Insecurity (10/20/2023)   Hunger Vital Sign    Worried About Running Out of Food in the Last Year: Never true    Ran Out of Food in the Last Year: Never true  Transportation Needs: No Transportation Needs (10/20/2023)   PRAPARE - Administrator, Civil Service (Medical): No    Lack of Transportation (Non-Medical): No  Physical Activity: Inactive (03/06/2023)   Exercise Vital Sign    Days of Exercise per Week: 0 days    Minutes of Exercise per Session: 0 min  Stress: No Stress Concern Present (11/24/2023)   [employer] of Occupational Health - Occupational Stress Questionnaire    Feeling of Stress: Only a little  Recent Concern: Stress -  Stress Concern Present (10/20/2023)   [employer] of Occupational Health - Occupational Stress Questionnaire    Feeling of Stress: To some extent  Social Connections: Moderately Isolated (03/06/2023)   Social Connection and Isolation Panel    Frequency of Communication with Friends and Family: More than three times a week    Frequency of Social Gatherings with Friends and Family: Once a week    Attends Religious Services: Never    Database Administrator or Organizations: No    Attends Banker Meetings: Never    Marital Status: Married  Catering Manager Violence: Not At Risk (10/20/2023)   Humiliation, Afraid, Rape, and Kick questionnaire    Fear of Current or Ex-Partner: No    Emotionally Abused: No    Physically Abused: No    Sexually Abused: No     Constitutional: Patient reports intermittent headaches, abnormal weight gain.  Denies fever, malaise, fatigue.  HEENT: Denies eye pain, eye redness, ear pain, ringing in the ears, wax buildup, runny nose, nasal congestion, bloody nose, or sore throat. Respiratory: Denies difficulty breathing, shortness of breath, cough or sputum production.   Cardiovascular: Denies chest pain, chest tightness, palpitations or swelling in the hands or feet.  Gastrointestinal: Patient reports intermittent reflux.  Denies abdominal pain, bloating, constipation, diarrhea or blood in the stool.  GU: Denies urgency, frequency, pain with urination, burning sensation, blood in urine, odor or discharge. Musculoskeletal: Denies decrease in range of motion, difficulty with gait, muscle pain or joint swelling.  Skin: Patient reports dry skin, itching.  Denies redness, lesions or ulcercations.  Neurological: Patient reports insomnia, restless legs, hot flashes, night sweats and brain fog.  Denies dizziness, difficulty with memory, difficulty with speech or problems with balance and coordination.  Psych: Patient has a history of mood swings, anxiety and  depression.  Denies SI/HI.  No other specific complaints in a complete review of systems (except as listed in HPI above).     Objective:   Physical Exam BP 132/72 (BP Location: Right Arm, Patient Position: Sitting, Cuff Size: Large)   Ht 5' 2 (1.575 m)   Wt 259 lb 12.8 oz (117.8 kg)   LMP 01/14/2019 (Exact Date)   BMI 47.52 kg/m    Wt Readings from Last 3 Encounters:  11/07/23 243 lb 2 oz (110.3 kg)  05/22/23 248 lb 6.4 oz (112.7 kg)  04/18/23 252 lb (114.3 kg)    General: Appears her stated age, obese, in NAD. Skin: Warm, dry and intact. Dyshidrotic eczema noted of hands.  Cardiovascular: Normal rate and rhythm. S1,S2 noted.  No  murmur, rubs or gallops noted.  Pulmonary/Chest: Normal effort and positive vesicular breath sounds. No respiratory distress. No wheezes, rales or ronchi noted.  Musculoskeletal: No difficulty with gait.  Neurological: Alert and oriented.  Psychiatric: Mood and affect normal. Behavior is normal. Judgment and thought content normal.     BMET    Component Value Date/Time   NA 141 11/07/2023 1111   K 4.8 11/07/2023 1111   CL 106 11/07/2023 1111   CO2 31 11/07/2023 1111   GLUCOSE 85 11/07/2023 1111   BUN 13 11/07/2023 1111   CREATININE 0.73 11/07/2023 1111   CALCIUM 9.1 11/07/2023 1111   GFRNONAA >60 04/12/2022 2056   GFRAA >60 10/14/2019 1233    Lipid Panel     Component Value Date/Time   CHOL 113 11/07/2023 1111   TRIG 62 11/07/2023 1111   HDL 47 (L) 11/07/2023 1111   CHOLHDL 2.4 11/07/2023 1111   LDLCALC 52 11/07/2023 1111    CBC    Component Value Date/Time   WBC 4.9 11/07/2023 1111   RBC 5.09 11/07/2023 1111   HGB 9.5 (L) 11/07/2023 1111   HCT 35.0 11/07/2023 1111   PLT 225 11/07/2023 1111   MCV 68.8 (L) 11/07/2023 1111   MCH 18.7 (L) 11/07/2023 1111   MCHC 27.1 (L) 11/07/2023 1111   RDW 16.6 (H) 11/07/2023 1111   LYMPHSABS 2.6 12/08/2021 1928   MONOABS 0.6 12/08/2021 1928   EOSABS 0.1 12/08/2021 1928   BASOSABS 0.1  12/08/2021 1928    Hgb A1C Lab Results  Component Value Date   HGBA1C 5.6 11/07/2023            Assessment & Plan:  Assessment and Plan    Menopausal symptoms Significant menopausal symptoms. Prefers non-hormonal treatment due to hormone therapy risks and family history of estrogen-positive cancer. Discussed SSRIs/SNRIs, Paxil preferred. - Prescribed Paxil 12.5 mg daily. - Advised to message via MyChart for dose adjustment after one month. - Consider having her meet with Leita Gower.D. at [facility] to discuss bioidentical hormone therapy       RTC in 3 months, follow-up chronic conditions Angeline Laura, NP

## 2024-02-13 ENCOUNTER — Telehealth: Payer: Self-pay

## 2024-02-13 ENCOUNTER — Other Ambulatory Visit (HOSPITAL_COMMUNITY): Payer: Self-pay

## 2024-02-13 NOTE — Telephone Encounter (Signed)
 Pharmacy Patient Advocate Encounter   Received notification from Onbase that prior authorization for Paroxetine 12.5 mg ER is required/requested.   Insurance verification completed.   The patient is insured through [employer].   Per test claim:  See below is preferred by the insurance.  If suggested medication is appropriate, Please send in a new RX and discontinue this one. If not, please advise as to why it's not appropriate so that we may request a Prior Authorization. Please note, some preferred medications may still require a PA.  If the suggested medications have not been trialed and there are no contraindications to their use, the PA will not be submitted, as it will not be approved.

## 2024-02-20 MED ORDER — PAROXETINE HCL 10 MG PO TABS: 10.0000 mg | ORAL_TABLET | Freq: Every day | ORAL | 1 refills | Status: DC

## 2024-02-20 NOTE — Telephone Encounter (Signed)
 Paroxetine 10 mg sent to pharmacy as they will not cover the 12.5 mg CR

## 2024-02-20 NOTE — Addendum Note (Signed)
 Addended by: ANTONETTE ANGELINE ORN on: 02/20/2024 02:38 PM   Modules accepted: Orders

## 2024-02-20 NOTE — Telephone Encounter (Signed)
 Please see previous note.   [id number]

## 2024-02-21 ENCOUNTER — Other Ambulatory Visit: Payer: Self-pay | Admitting: Family Medicine

## 2024-02-21 MED ORDER — PAROXETINE HCL 10 MG PO TABS: 10.0000 mg | ORAL_TABLET | Freq: Every day | ORAL | 1 refills | Status: AC

## 2024-02-21 NOTE — Addendum Note (Signed)
 Addended by: ZELIA GAUZE D on: 02/21/2024 08:16 AM   Modules accepted: Orders

## 2024-02-22 MED ORDER — GABAPENTIN 300 MG PO CAPS: 300.0000 mg | ORAL_CAPSULE | Freq: Two times a day (BID) | ORAL | 1 refills | Status: AC

## 2024-02-22 NOTE — Addendum Note (Signed)
 Addended by: ANTONETTE ANGELINE ORN on: 02/22/2024 09:26 AM   Modules accepted: Orders

## 2024-02-23 NOTE — Telephone Encounter (Signed)
 Requested Prescriptions  Refused Prescriptions Disp Refills   gabapentin  (NEURONTIN ) 300 MG capsule [Pharmacy Med Name: GABAPENTIN  300 MG CAPSULE] 180 capsule 1    Sig: TAKE 1 CAPSULE BY MOUTH TWICE A DAY     Neurology: Anticonvulsants - gabapentin  Passed - 02/23/2024  1:11 PM      Passed - Cr in normal range and within 360 days    Creat  Date Value Ref Range Status  11/07/2023 0.73 0.50 - 1.03 mg/dL Final         Passed - Completed PHQ-2 or PHQ-9 in the last 360 days      Passed - Valid encounter within last 12 months    Recent Outpatient Visits           1 week ago Menopausal symptoms   [facility] [place], Angeline ORN, NP   3 months ago Encounter for general adult medical examination with abnormal findings   [place] [facility] [place], Angeline ORN, NP   9 months ago Rash and nonspecific skin eruption   [facility] [place], Angeline ORN, [place]

## 2024-03-12 DIAGNOSIS — S92355A Nondisplaced fracture of fifth metatarsal bone, left foot, initial encounter for closed fracture: Secondary | ICD-10-CM | POA: Diagnosis not present

## 2024-03-12 DIAGNOSIS — S8265XA Nondisplaced fracture of lateral malleolus of left fibula, initial encounter for closed fracture: Secondary | ICD-10-CM | POA: Diagnosis not present

## 2024-03-12 DIAGNOSIS — M7662 Achilles tendinitis, left leg: Secondary | ICD-10-CM | POA: Diagnosis not present

## 2024-04-10 ENCOUNTER — Other Ambulatory Visit: Payer: Self-pay

## 2024-04-10 NOTE — Patient Outreach (Signed)
 Complex Care Management   Visit Note  04/10/2024  Name:  Terri Conner MRN: [id number] DOB: 09-Oct-1970  Situation: Referral received for Complex Care Management related to Migraines, pain management I obtained verbal consent from Patient.  Visit completed with Patient  on the phone  Background:   Past Medical History:  Diagnosis Date   Migraines     Assessment: Patient Reported Symptoms:  Cognitive Cognitive Status: No symptoms reported   Health Maintenance Behaviors: Annual physical exam Healing Pattern: Average  Neurological Neurological Review of Symptoms: Headaches Neurological Management Strategies: Routine screening Neurological Self-Management Outcome: 4 (good)  HEENT HEENT Symptoms Reported: No symptoms reported HEENT Management Strategies: Routine screening HEENT Self-Management Outcome: 4 (good)    Cardiovascular Cardiovascular Symptoms Reported: No symptoms reported Does patient have uncontrolled Hypertension?: No Cardiovascular Management Strategies: Adequate rest, Coping strategies Cardiovascular Self-Management Outcome: 4 (good)  Respiratory Respiratory Symptoms Reported: Dry cough Other Respiratory Symptoms: sneezing Respiratory Management Strategies: Routine screening Respiratory Self-Management Outcome: 4 (good)  Endocrine Endocrine Symptoms Reported: Increased thirst, Headaches Is patient diabetic?: No Endocrine Comment: eats when she gets shaky  Gastrointestinal Gastrointestinal Symptoms Reported: No symptoms reported Gastrointestinal Management Strategies: Adequate rest Gastrointestinal Self-Management Outcome: 4 (good)    Genitourinary Genitourinary Symptoms Reported: No symptoms reported Genitourinary Management Strategies: Adequate rest Genitourinary Self-Management Outcome: 4 (good)  Integumentary Integumentary Symptoms Reported: Itching Additional Integumentary Details: dry skin using creams Skin Management Strategies: Routine screening Skin  Self-Management Outcome: 4 (good)  Musculoskeletal Musculoskelatal Symptoms Reviewed: Difficulty walking, Joint pain Additional Musculoskeletal Details: knees Musculoskeletal Management Strategies: Routine screening, Coping strategies Musculoskeletal Self-Management Outcome: 3 (uncertain) Falls in the past year?: Yes Number of falls in past year: 2 or more Was there an injury with Fall?: Yes Fall Risk Category Calculator: 3 Patient Fall Risk Level: High Fall Risk Fall risk Follow up: Falls evaluation completed, Education provided, Falls prevention discussed  Psychosocial Psychosocial Symptoms Reported: Difficulty concentrating Behavioral Management Strategies: Activity, Coping strategies Behavioral Health Comment: feels its due to menopause Major Change/Loss/Stressor/Fears (CP): Denies Techniques to Cope with Loss/Stress/Change: None Quality of Family Relationships: non-existent    04/10/2024    PHQ2-9 Depression Screening   Little interest or pleasure in doing things Not at all  Feeling down, depressed, or hopeless Not at all  PHQ-2 - Total Score 0  Trouble falling or staying asleep, or sleeping too much    Feeling tired or having little energy    Poor appetite or overeating     Feeling bad about yourself - or that you are a failure or have let yourself or your family down    Trouble concentrating on things, such as reading the newspaper or watching television    Moving or speaking so slowly that other people could have noticed.  Or the opposite - being so fidgety or restless that you have been moving around a lot more than usual    Thoughts that you would be better off dead, or hurting yourself in some way    PHQ2-9 Total Score    If you checked off any problems, how difficult have these problems made it for you to do your work, take care of things at home, or get along with other people    Depression Interventions/Treatment      There were no vitals filed for this visit. Pain  Scale: 0-10 Pain Score: 0-No pain  Medications Reviewed Today     Reviewed by Nivia Loie Jahr , RN (Registered Nurse) on 04/10/24 at 1010  Med List Status: <  None>   Medication Order Taking? Sig Documenting Provider Last Dose Status Informant  fexofenadine  (ALLEGRA  ALLERGY) 180 MG tablet [id number] Yes Take 1 tablet (180 mg total) by mouth daily. Antonette Angeline ORN, NP  Active   gabapentin  (NEURONTIN ) 300 MG capsule [id number]  Take 1 capsule (300 mg total) by mouth 2 (two) times daily.  Patient not taking: Reported on 04/10/2024   Antonette Angeline ORN, NP  Active   PARoxetine  (PAXIL ) 10 MG tablet [id number] Yes Take 1 tablet (10 mg total) by mouth daily. Antonette Angeline ORN, NP  Active   traZODone  (DESYREL ) 50 MG tablet [id number] Yes TAKE 3 TABLETS (150 MG TOTAL) BY MOUTH AT BEDTIME AS NEEDED FOR SLEEP. Antonette Angeline ORN, NP  Active             Recommendation:   Continue Current Plan of Care  Follow Up Plan:   Patient has met all care management goals. Care Management case will be closed. Patient has been provided contact information should new needs arise.   Tyrica Afzal  Administrator, Sports [employer] [phone]

## 2024-04-10 NOTE — Patient Instructions (Signed)
 Visit Information  Terri Conner was given information about Medicaid Managed Care team care coordination services as a part of their [place] [employer] benefit.   If you would like to schedule transportation through your [place] plan, please call the following number at least 2 days in advance of your appointment: [phone].   There is no limit to the number of trips during the year between medical appointments, healthcare facilities, or pharmacies. Transportation must be scheduled at least 2 business days before but not more than thirty 30 days before of your appointment.  Call the Behavioral Health Crisis Line at [phone], at any time, 24 hours a day, 7 days a week. If you are in danger or need immediate medical attention call 911.   Please see education materials related to pain management provided by MyChart link.  Patient verbalizes understanding of instructions and care plan provided today and agrees to view in MyChart. Active MyChart status and patient understanding of how to access instructions and care plan via MyChart confirmed with patient.     RN Care Manager will close case, goals met  Terri Dehaan RN RN Care Manager [name] [phone]   Following is a copy of your plan of care:   Goals Addressed             This Visit's Progress    COMPLETED: VBCI RN Care Plan       Problems:  Chronic Disease Management support and education needs related to Migraines   Goal: Over the next 90 days the patient will: Attend all scheduled medical appointments Take all medications exactly as prescribed  Demonstrate ongoing adherence to prescribed treatment plan for management of migraines Schedule Eye exam Collaborate with the clinic and care management team regarding care needs.   Interventions:  Health Maintenance Interventions: Patient interviewed about adult health maintenance status. Discussed plan to complete annual eye  exam. She is agreeable to completing the exam, however notes she is currently busy with pending divorce and considering arrangements for relocating. Offered resources for assistance with housing. Declined current need for assistance at this time. Agreed to update the care team if assistance is required. Provided information for accessing [email or web address]. Patient agreed to review site if resources are needed.  Migraine Interventions:(Status: Goal on track ) Long Term Goal  Pain assessment performed Medications reviewed Reviewed provider established plan for management of migraines Discussed importance of adherence to all scheduled medical appointments Counseled on the importance of reporting any/all new or changed pain symptoms or management strategies to Primary Care Provider.  Reports managing migraines well. Reviewed use of relaxation techniques and/or diversional activities to assist with pain reduction Reviewed with patient prescribed pharmacological and nonpharmacological pain relief strategies Screening for signs and symptoms of depression related to chronic disease state  Relocated Denies current need for assistance. Agreed to keep the care team updated of care management needs.    Patient Self-Care Activities:  Attend all scheduled provider appointments Call pharmacy for medication refills 3-7 days in advance of running out of medications Call provider office for new concerns or questions  Perform all self care activities independently  Take medications as prescribed    Goal Met

## 2024-05-07 ENCOUNTER — Ambulatory Visit: Admitting: Internal Medicine

## 2024-05-10 ENCOUNTER — Telehealth

## 2024-05-10 ENCOUNTER — Other Ambulatory Visit: Payer: Self-pay | Admitting: Internal Medicine
